# Patient Record
Sex: Male | Born: 1945 | Race: White | Hispanic: No | Smoking: Former smoker
Health system: Southern US, Community
[De-identification: ages and names within clinical notes are randomized; demographics above are authoritative.]

## PROBLEM LIST (undated history)

## (undated) DIAGNOSIS — C8590 Non-Hodgkin lymphoma, unspecified, unspecified site: Secondary | ICD-10-CM

## (undated) DIAGNOSIS — C801 Malignant (primary) neoplasm, unspecified: Secondary | ICD-10-CM

## (undated) DIAGNOSIS — I519 Heart disease, unspecified: Secondary | ICD-10-CM

## (undated) HISTORY — PX: CERVICAL DISC SURGERY: SHX588

## (undated) HISTORY — PX: CORONARY ANGIOPLASTY WITH STENT PLACEMENT: SHX49

---

## 2004-09-09 ENCOUNTER — Other Ambulatory Visit: Payer: Self-pay

## 2010-10-31 ENCOUNTER — Emergency Department: Payer: Self-pay | Admitting: Emergency Medicine

## 2010-11-11 ENCOUNTER — Emergency Department: Payer: Self-pay | Admitting: Emergency Medicine

## 2013-04-14 ENCOUNTER — Ambulatory Visit: Payer: Self-pay | Admitting: Internal Medicine

## 2013-10-11 ENCOUNTER — Emergency Department: Payer: Self-pay | Admitting: Emergency Medicine

## 2013-10-11 LAB — COMPREHENSIVE METABOLIC PANEL
Albumin: 3.5 g/dL (ref 3.4–5.0)
Bilirubin,Total: 0.7 mg/dL (ref 0.2–1.0)
Calcium, Total: 9.3 mg/dL (ref 8.5–10.1)
Chloride: 106 mmol/L (ref 98–107)
Creatinine: 1.53 mg/dL — ABNORMAL HIGH (ref 0.60–1.30)
EGFR (Non-African Amer.): 47 — ABNORMAL LOW
Glucose: 103 mg/dL — ABNORMAL HIGH (ref 65–99)
Osmolality: 279 (ref 275–301)
SGOT(AST): 18 U/L (ref 15–37)
SGPT (ALT): 29 U/L (ref 12–78)
Sodium: 139 mmol/L (ref 136–145)

## 2013-10-11 LAB — URINALYSIS, COMPLETE
Bacteria: NONE SEEN
Bilirubin,UR: NEGATIVE
Glucose,UR: NEGATIVE mg/dL (ref 0–75)
Ketone: NEGATIVE
Leukocyte Esterase: NEGATIVE
Ph: 6 (ref 4.5–8.0)
RBC,UR: 1 /HPF (ref 0–5)
Squamous Epithelial: NONE SEEN

## 2013-10-11 LAB — CBC
HCT: 43.6 % (ref 40.0–52.0)
MCV: 94 fL (ref 80–100)
Platelet: 208 10*3/uL (ref 150–440)
RBC: 4.64 10*6/uL (ref 4.40–5.90)
RDW: 14.1 % (ref 11.5–14.5)
WBC: 11 10*3/uL — ABNORMAL HIGH (ref 3.8–10.6)

## 2013-10-11 LAB — TROPONIN I: Troponin-I: <0.02 ng/mL

## 2013-10-11 LAB — CK TOTAL AND CKMB (NOT AT ARMC): CK-MB: 2 ng/mL (ref 0.5–3.6)

## 2013-10-11 LAB — LIPASE, BLOOD: Lipase: 135 U/L (ref 73–393)

## 2015-06-20 DIAGNOSIS — I2510 Atherosclerotic heart disease of native coronary artery without angina pectoris: Secondary | ICD-10-CM | POA: Insufficient documentation

## 2015-06-20 DIAGNOSIS — R945 Abnormal results of liver function studies: Secondary | ICD-10-CM | POA: Insufficient documentation

## 2015-06-20 DIAGNOSIS — C8590 Non-Hodgkin lymphoma, unspecified, unspecified site: Secondary | ICD-10-CM | POA: Insufficient documentation

## 2015-06-20 DIAGNOSIS — I10 Essential (primary) hypertension: Secondary | ICD-10-CM | POA: Insufficient documentation

## 2015-06-20 DIAGNOSIS — R7989 Other specified abnormal findings of blood chemistry: Secondary | ICD-10-CM | POA: Insufficient documentation

## 2015-06-20 DIAGNOSIS — E785 Hyperlipidemia, unspecified: Secondary | ICD-10-CM | POA: Insufficient documentation

## 2015-06-20 DIAGNOSIS — E039 Hypothyroidism, unspecified: Secondary | ICD-10-CM | POA: Insufficient documentation

## 2015-07-11 ENCOUNTER — Other Ambulatory Visit: Payer: Self-pay | Admitting: Internal Medicine

## 2015-07-13 ENCOUNTER — Other Ambulatory Visit: Payer: Self-pay | Admitting: Internal Medicine

## 2015-07-17 ENCOUNTER — Other Ambulatory Visit: Payer: Self-pay | Admitting: Internal Medicine

## 2015-07-28 ENCOUNTER — Encounter: Payer: Self-pay | Admitting: Internal Medicine

## 2015-07-28 ENCOUNTER — Ambulatory Visit (INDEPENDENT_AMBULATORY_CARE_PROVIDER_SITE_OTHER): Payer: Medicare Other | Admitting: Internal Medicine

## 2015-07-28 VITALS — BP 128/80 | HR 88 | Temp 98.4°F | Ht 64.0 in | Wt 169.8 lb

## 2015-07-28 DIAGNOSIS — E782 Mixed hyperlipidemia: Secondary | ICD-10-CM | POA: Diagnosis not present

## 2015-07-28 DIAGNOSIS — H109 Unspecified conjunctivitis: Secondary | ICD-10-CM | POA: Diagnosis not present

## 2015-07-28 DIAGNOSIS — E034 Atrophy of thyroid (acquired): Secondary | ICD-10-CM

## 2015-07-28 DIAGNOSIS — I10 Essential (primary) hypertension: Secondary | ICD-10-CM | POA: Diagnosis not present

## 2015-07-28 DIAGNOSIS — H6691 Otitis media, unspecified, right ear: Secondary | ICD-10-CM

## 2015-07-28 DIAGNOSIS — E038 Other specified hypothyroidism: Secondary | ICD-10-CM

## 2015-07-28 MED ORDER — AMOXICILLIN-POT CLAVULANATE 875-125 MG PO TABS: 1.0000 | ORAL_TABLET | Freq: Two times a day (BID) | ORAL | Status: DC

## 2015-07-28 MED ORDER — NEOMYCIN-POLYMYXIN-DEXAMETH 3.5-10000-0.1 OP SUSP: 2.0000 [drp] | Freq: Four times a day (QID) | OPHTHALMIC | Status: DC

## 2015-07-28 NOTE — Progress Notes (Signed)
Date:  07/28/2015   Name:  Kyle Choi   DOB:  06-25-1946   MRN:  [id number]   Chief Complaint: Sinusitis Sinusitis This is a new problem. The current episode started in the past 7 days. The problem is unchanged. There has been no fever. He is experiencing no pain. Associated symptoms include congestion, ear pain, headaches, a hoarse voice, sinus pressure and a sore throat. Pertinent negatives include no diaphoresis, neck pain, shortness of breath or swollen glands. Past treatments include oral decongestants. The treatment provided mild relief.  Hypertension This is a chronic problem. The current episode started more than 1 year ago. The problem is unchanged. The problem is controlled. Associated symptoms include headaches. Pertinent negatives include no chest pain, neck pain, palpitations or shortness of breath. Past treatments include angiotensin blockers and beta blockers. The current treatment provides significant improvement. There are no compliance problems.   Hyperlipidemia This is a chronic problem. The problem is controlled. Pertinent negatives include no chest pain, focal weakness, leg pain, myalgias or shortness of breath. Current antihyperlipidemic treatment includes statins. The current treatment provides significant improvement of lipids.   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  He sees his oncologist next month.  He denies weight loss, nausea or headache.  Review of Systems:  Review of Systems  Constitutional: Negative for diaphoresis.  HENT: Positive for congestion, ear pain, hoarse voice, sinus pressure and sore throat. Negative for tinnitus.   Respiratory: Negative for chest tightness and shortness of breath.   Cardiovascular: Negative for chest pain, palpitations and leg swelling.  Gastrointestinal: Negative for abdominal pain and abdominal distention.  Musculoskeletal: Negative for myalgias, arthralgias and neck pain.  Neurological: Positive for headaches. Negative for focal  weakness.  Psychiatric/Behavioral: Negative for dysphoric mood.    Patient Active Problem List   Diagnosis Date Noted  . CAD in native artery 06/20/2015  . Lymphoma in remission 06/20/2015  . Abnormal LFTs 06/20/2015  . Essential (primary) hypertension 06/20/2015  . Hypothyroidism 06/20/2015  . Lymphoma 06/20/2015  . Combined fat and carbohydrate induced hyperlipemia 06/20/2015    Prior to Admission medications   Medication Sig Start Date End Date Taking? Authorizing Provider  aspirin 81 MG tablet Take 1 tablet by mouth daily.   Yes Historical Provider, MD  atorvastatin (LIPITOR) 40 MG tablet Take 1 tablet by mouth daily. 04/12/15  Yes Historical Provider, MD  levothyroxine (SYNTHROID, LEVOTHROID) 100 MCG tablet TAKE ONE TABLET BY MOUTH ONCE DAILY 07/17/15  Yes Glean Hess, MD  losartan (COZAAR) 50 MG tablet Take 1 tablet by mouth daily. 04/12/15  Yes Historical Provider, MD  metoprolol tartrate (LOPRESSOR) 25 MG tablet TAKE ONE-HALF TABLET BY MOUTH TWICE DAILY 07/13/15  Yes Glean Hess, MD  potassium chloride 20 MEQ/15ML (10%) SOLN Take by mouth.   Yes Historical Provider, MD    Allergies  Allergen Reactions  . Ace Inhibitors Cough    Past Surgical History  Procedure Laterality Date  . Cervical disc surgery      c1-7 [facility]  . Coronary angioplasty with stent placement      History  Substance Use Topics  . Smoking status: Former Research scientist (life sciences)  . Smokeless tobacco: Not on file  . Alcohol Use: No     Medication list has been reviewed and updated.  Physical Examination:  Physical Exam  BP 162/70 mmHg  Pulse 88  Temp(Src) 98.4 F (36.9 C)  Ht 5\' 4"  (1.626 m)  Wt 169 lb 12.8 oz (77.021 kg)  BMI 29.13 kg/m2  SpO2 97%  Assessment and Plan: 1. Essential (primary) hypertension controlled  2. Combined fat and carbohydrate induced hyperlipemia On statin therapy Need follow up labs at [facility]  3. 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  Bilateral conjunctivitis - neomycin-polymyxin b-dexamethasone (MAXITROL) 3.5-10000-0.1 SUSP; Place 2 drops into both eyes every 6 (six) hours.  Dispense: 5 mL; Refill: 0  5. Otitis, right Use mucinex-D bid for 5 days - amoxicillin-clavulanate (AUGMENTIN) 875-125 MG per tablet; Take 1 tablet by mouth 2 (two) times daily.  Dispense: 20 tablet; Refill: 0   Halina Maidens, MD [facility]  07/28/2015

## 2015-08-09 ENCOUNTER — Encounter: Payer: Self-pay | Admitting: Internal Medicine

## 2015-08-15 ENCOUNTER — Other Ambulatory Visit: Payer: Self-pay | Admitting: Internal Medicine

## 2015-09-10 ENCOUNTER — Other Ambulatory Visit: Payer: Self-pay | Admitting: Internal Medicine

## 2015-09-11 ENCOUNTER — Other Ambulatory Visit: Payer: Self-pay

## 2015-10-02 ENCOUNTER — Encounter: Payer: Self-pay | Admitting: Emergency Medicine

## 2015-10-02 DIAGNOSIS — Z87891 Personal history of nicotine dependence: Secondary | ICD-10-CM | POA: Insufficient documentation

## 2015-10-02 DIAGNOSIS — R21 Rash and other nonspecific skin eruption: Secondary | ICD-10-CM | POA: Insufficient documentation

## 2015-10-02 NOTE — ED Notes (Signed)
Patient ambulatory to triage with steady gait, without difficulty or distress noted; pt reports since this morning having itchy rash with no known cause

## 2015-10-03 ENCOUNTER — Emergency Department
Admission: EM | Admit: 2015-10-03 | Discharge: 2015-10-03 | Discharge status: Against Medical Advice | Payer: Medicare Other | Attending: Emergency Medicine | Admitting: Emergency Medicine

## 2015-10-08 ENCOUNTER — Other Ambulatory Visit: Payer: Self-pay | Admitting: Internal Medicine

## 2015-10-31 ENCOUNTER — Encounter: Payer: Medicare Other | Admitting: Internal Medicine

## 2015-11-05 ENCOUNTER — Other Ambulatory Visit: Payer: Self-pay | Admitting: Internal Medicine

## 2015-11-06 ENCOUNTER — Other Ambulatory Visit: Payer: Self-pay | Admitting: Internal Medicine

## 2015-11-16 ENCOUNTER — Ambulatory Visit: Payer: Medicare Other | Admitting: Internal Medicine

## 2015-11-22 ENCOUNTER — Ambulatory Visit: Payer: Medicare Other | Admitting: Internal Medicine

## 2015-12-11 ENCOUNTER — Other Ambulatory Visit: Payer: Self-pay | Admitting: Internal Medicine

## 2016-01-08 ENCOUNTER — Other Ambulatory Visit: Payer: Self-pay | Admitting: Internal Medicine

## 2016-01-08 NOTE — Telephone Encounter (Signed)
No additional refills until seen for HTN and lipid followup.

## 2016-01-10 NOTE — Telephone Encounter (Signed)
pts coming in on 2/22

## 2016-02-08 ENCOUNTER — Other Ambulatory Visit: Payer: Self-pay | Admitting: Internal Medicine

## 2016-02-21 ENCOUNTER — Ambulatory Visit (INDEPENDENT_AMBULATORY_CARE_PROVIDER_SITE_OTHER): Payer: Medicare Other | Admitting: Internal Medicine

## 2016-02-21 ENCOUNTER — Encounter: Payer: Self-pay | Admitting: Internal Medicine

## 2016-02-21 VITALS — BP 132/66 | HR 68 | Ht 64.0 in | Wt 161.2 lb

## 2016-02-21 DIAGNOSIS — E038 Other specified hypothyroidism: Secondary | ICD-10-CM

## 2016-02-21 DIAGNOSIS — C8590 Non-Hodgkin lymphoma, unspecified, unspecified site: Secondary | ICD-10-CM

## 2016-02-21 DIAGNOSIS — R7989 Other specified abnormal findings of blood chemistry: Secondary | ICD-10-CM

## 2016-02-21 DIAGNOSIS — I2510 Atherosclerotic heart disease of native coronary artery without angina pectoris: Secondary | ICD-10-CM | POA: Diagnosis not present

## 2016-02-21 DIAGNOSIS — I10 Essential (primary) hypertension: Secondary | ICD-10-CM

## 2016-02-21 DIAGNOSIS — Z8572 Personal history of non-Hodgkin lymphomas: Secondary | ICD-10-CM

## 2016-02-21 DIAGNOSIS — R945 Abnormal results of liver function studies: Secondary | ICD-10-CM

## 2016-02-21 DIAGNOSIS — E034 Atrophy of thyroid (acquired): Secondary | ICD-10-CM | POA: Diagnosis not present

## 2016-02-21 MED ORDER — POTASSIUM CHLORIDE 20 MEQ/15ML (10%) PO SOLN: 10.0000 meq | Freq: Two times a day (BID) | ORAL | Status: DC

## 2016-02-21 NOTE — Progress Notes (Signed)
Date:  02/21/2016   Name:  ANTWAIN DAM   DOB:  Nov 12, 1946   MRN:  [id number]   Chief Complaint: Follow-up; Hypertension; and Hypothyroidism Hypertension This is a chronic problem. The current episode started more than 1 year ago. The problem is unchanged. The problem is controlled. Associated symptoms include headaches. Pertinent negatives include no chest pain, palpitations or shortness of breath. Hypertensive end-organ damage includes a thyroid problem.  Thyroid Problem Presents for follow-up visit. Patient reports no constipation, depressed mood, fatigue, heat intolerance, hoarse voice, leg swelling or palpitations. Past treatments include levothyroxine.   CAD -  Patient reports no chest pain or SOB.  He is active caring for his 74 month old grandson.  He sees his cardiologist regularly but has not had labs done recently.  Lymphoma - reported to be in remission per his Oncologist.  He continues to see them every 6 months and this year, if all is clear, will decrease to once per year.   Review of Systems  Constitutional: Negative for fever, chills and fatigue.  HENT: Negative for hearing loss and hoarse voice.   Eyes: Positive for discharge and visual disturbance. Negative for photophobia.  Respiratory: Negative for cough, chest tightness and shortness of breath.   Cardiovascular: Negative for chest pain, palpitations and leg swelling.  Gastrointestinal: Negative for constipation.  Endocrine: Negative for heat intolerance.  Genitourinary: Negative for dysuria.  Neurological: Positive for dizziness, light-headedness and headaches.    Patient Active Problem List   Diagnosis Date Noted  . CAD in native artery 06/20/2015  . Lymphoma in remission ([place]) 06/20/2015  . Abnormal LFTs 06/20/2015  . Essential (primary) hypertension 06/20/2015  . Hypothyroidism 06/20/2015  . Lymphoma ([place]) 06/20/2015  . 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 Authorizing Provider  atorvastatin (LIPITOR) 40 MG tablet TAKE ONE TABLET BY MOUTH ONCE DAILY 11/05/15  Yes Glean Hess, MD  levothyroxine (SYNTHROID, LEVOTHROID) 100 MCG tablet TAKE ONE TABLET BY MOUTH ONCE DAILY 02/08/16  Yes Glean Hess, MD  losartan (COZAAR) 50 MG tablet TAKE ONE TABLET BY MOUTH ONCE DAILY 02/08/16  Yes Glean Hess, MD  metoprolol tartrate (LOPRESSOR) 25 MG tablet TAKE ONE-HALF TABLET BY MOUTH TWICE DAILY 02/08/16  Yes Glean Hess, MD  potassium chloride 20 MEQ/15ML (10%) SOLN Take by mouth.   Yes Historical Provider, MD  aspirin 81 MG tablet Take 1 tablet by mouth daily.    Historical Provider, MD    Allergies  Allergen Reactions  . Ace Inhibitors Cough    Past Surgical History  Procedure Laterality Date  . Cervical disc surgery      c1-7 [facility]  . Coronary angioplasty with stent placement      Social History  Substance Use Topics  . Smoking status: Former Research scientist (life sciences)  . Smokeless tobacco: None  . Alcohol Use: No    Medication list has been reviewed and updated.  Physical Exam  Constitutional: He is oriented to person, place, and time. He appears well-developed. No distress.  HENT:  Head: Normocephalic and atraumatic.  Neck: Normal range of motion. No thyromegaly present.  Cardiovascular: Normal rate, regular rhythm and normal heart sounds.   Pulmonary/Chest: Effort normal and breath sounds normal. No respiratory distress.  Musculoskeletal: Normal range of motion.  Lymphadenopathy:    He has no cervical adenopathy.  Neurological: He is alert and oriented to person, place, and  time.  Skin: Skin is warm and dry. No rash noted.  Psychiatric: He has a normal mood and affect. His behavior is normal. Thought content normal.  Nursing note and vitals reviewed.   BP 132/66 mmHg  Pulse 68  Ht 5\' 4"  (1.626 m)  Wt 161 lb 3.2 oz (73.12 kg)  BMI 27.66 kg/m2  Assessment and Plan: 1.  Essential (primary) hypertension controlled - CBC with Differential/Platelet - Comprehensive metabolic panel  2. CAD in native artery Asymptomatic - continue aspirin, statin and anti-hypertensives - Lipid panel  3. 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 Lymphoma in remission ([place]) Followed by Oncology  5. Abnormal LFTs [place], MD [facility]  02/21/2016

## 2016-02-22 ENCOUNTER — Telehealth: Payer: Self-pay

## 2016-02-22 LAB — COMPREHENSIVE METABOLIC PANEL
A/G RATIO: 1.3 (ref 1.1–2.5)
ALK PHOS: 125 IU/L — AB (ref 39–117)
ALT: 12 IU/L (ref 0–44)
AST: 12 IU/L (ref 0–40)
Albumin: 3.8 g/dL (ref 3.6–4.8)
BILIRUBIN TOTAL: 0.7 mg/dL (ref 0.0–1.2)
BUN/Creatinine Ratio: 8 — ABNORMAL LOW (ref 10–22)
BUN: 10 mg/dL (ref 8–27)
CALCIUM: 9.5 mg/dL (ref 8.6–10.2)
CHLORIDE: 101 mmol/L (ref 96–106)
CO2: 26 mmol/L (ref 18–29)
Creatinine, Ser: 1.24 mg/dL (ref 0.76–1.27)
GFR calc Af Amer: 69 mL/min/{1.73_m2} (ref >59)
GFR calc non Af Amer: 59 mL/min/{1.73_m2} — ABNORMAL LOW (ref >59)
Globulin, Total: 2.9 g/dL (ref 1.5–4.5)
Glucose: 97 mg/dL (ref 65–99)
POTASSIUM: 4.7 mmol/L (ref 3.5–5.2)
Sodium: 142 mmol/L (ref 134–144)
Total Protein: 6.7 g/dL (ref 6.0–8.5)

## 2016-02-22 LAB — LIPID PANEL
CHOLESTEROL TOTAL: 162 mg/dL (ref 100–199)
Chol/HDL Ratio: 3.4 ratio units (ref 0.0–5.0)
HDL: 47 mg/dL (ref >39)
LDL Calculated: 101 mg/dL — ABNORMAL HIGH (ref 0–99)
TRIGLYCERIDES: 68 mg/dL (ref 0–149)
VLDL Cholesterol Cal: 14 mg/dL (ref 5–40)

## 2016-02-22 LAB — CBC WITH DIFFERENTIAL/PLATELET
BASOS: 0 %
Basophils Absolute: 0 10*3/uL (ref 0.0–0.2)
EOS (ABSOLUTE): 0.3 10*3/uL (ref 0.0–0.4)
Eos: 2 %
Hematocrit: 46.1 % (ref 37.5–51.0)
Hemoglobin: 14.9 g/dL (ref 12.6–17.7)
IMMATURE GRANULOCYTES: 0 %
Immature Grans (Abs): 0 10*3/uL (ref 0.0–0.1)
Lymphocytes Absolute: 1.3 10*3/uL (ref 0.7–3.1)
Lymphs: 10 %
MCH: 29.6 pg (ref 26.6–33.0)
MCHC: 32.3 g/dL (ref 31.5–35.7)
MCV: 92 fL (ref 79–97)
MONOS ABS: 1.2 10*3/uL — AB (ref 0.1–0.9)
Monocytes: 9 %
NEUTROS PCT: 79 %
Neutrophils Absolute: 10.1 10*3/uL — ABNORMAL HIGH (ref 1.4–7.0)
PLATELETS: 312 10*3/uL (ref 150–379)
RBC: 5.04 x10E6/uL (ref 4.14–5.80)
RDW: 14.9 % (ref 12.3–15.4)
WBC: 12.9 10*3/uL — ABNORMAL HIGH (ref 3.4–10.8)

## 2016-02-22 LAB — TSH: TSH: 0.226 u[IU]/mL — ABNORMAL LOW (ref 0.450–4.500)

## 2016-02-22 NOTE — Telephone Encounter (Signed)
Spoke with patient. Patient advised of all results and verbalized understanding. Will call back with any future questions or concerns. MAH  

## 2016-02-22 NOTE — Telephone Encounter (Signed)
-----   Message from Glean Hess, MD sent at 02/22/2016 12:23 PM EST ----- Labs are normal.  Kidney function is good.  Liver tests are stable.  Cholesterol is good.  Continue same medication.

## 2016-05-30 ENCOUNTER — Telehealth: Payer: Self-pay

## 2016-05-30 MED ORDER — METOPROLOL TARTRATE 25 MG PO TABS: 12.5000 mg | ORAL_TABLET | Freq: Two times a day (BID) | ORAL | Status: AC

## 2016-05-30 NOTE — Telephone Encounter (Signed)
ADVISED

## 2016-06-25 ENCOUNTER — Emergency Department: Payer: Medicare Other

## 2016-06-25 ENCOUNTER — Inpatient Hospital Stay
Admission: EM | Admit: 2016-06-25 | Discharge: 2016-06-29 | DRG: 065 | Disposition: A | Discharge status: Skilled Nursing Facility | Payer: Medicare Other | Attending: Internal Medicine | Admitting: Internal Medicine

## 2016-06-25 DIAGNOSIS — R1310 Dysphagia, unspecified: Secondary | ICD-10-CM | POA: Diagnosis present

## 2016-06-25 DIAGNOSIS — J189 Pneumonia, unspecified organism: Secondary | ICD-10-CM

## 2016-06-25 DIAGNOSIS — Z955 Presence of coronary angioplasty implant and graft: Secondary | ICD-10-CM

## 2016-06-25 DIAGNOSIS — Z8249 Family history of ischemic heart disease and other diseases of the circulatory system: Secondary | ICD-10-CM

## 2016-06-25 DIAGNOSIS — W19XXXA Unspecified fall, initial encounter: Secondary | ICD-10-CM

## 2016-06-25 DIAGNOSIS — Z87891 Personal history of nicotine dependence: Secondary | ICD-10-CM

## 2016-06-25 DIAGNOSIS — E785 Hyperlipidemia, unspecified: Secondary | ICD-10-CM | POA: Diagnosis present

## 2016-06-25 DIAGNOSIS — I10 Essential (primary) hypertension: Secondary | ICD-10-CM | POA: Diagnosis present

## 2016-06-25 DIAGNOSIS — I639 Cerebral infarction, unspecified: Principal | ICD-10-CM

## 2016-06-25 DIAGNOSIS — R471 Dysarthria and anarthria: Secondary | ICD-10-CM | POA: Diagnosis present

## 2016-06-25 DIAGNOSIS — D72829 Elevated white blood cell count, unspecified: Secondary | ICD-10-CM | POA: Diagnosis present

## 2016-06-25 DIAGNOSIS — R4182 Altered mental status, unspecified: Secondary | ICD-10-CM

## 2016-06-25 DIAGNOSIS — C8590 Non-Hodgkin lymphoma, unspecified, unspecified site: Secondary | ICD-10-CM | POA: Diagnosis present

## 2016-06-25 DIAGNOSIS — E039 Hypothyroidism, unspecified: Secondary | ICD-10-CM | POA: Diagnosis present

## 2016-06-25 DIAGNOSIS — Z7982 Long term (current) use of aspirin: Secondary | ICD-10-CM

## 2016-06-25 DIAGNOSIS — I69354 Hemiplegia and hemiparesis following cerebral infarction affecting left non-dominant side: Secondary | ICD-10-CM | POA: Insufficient documentation

## 2016-06-25 DIAGNOSIS — G459 Transient cerebral ischemic attack, unspecified: Secondary | ICD-10-CM

## 2016-06-25 DIAGNOSIS — I63212 Cerebral infarction due to unspecified occlusion or stenosis of left vertebral arteries: Secondary | ICD-10-CM | POA: Insufficient documentation

## 2016-06-25 DIAGNOSIS — R4586 Emotional lability: Secondary | ICD-10-CM | POA: Diagnosis present

## 2016-06-25 DIAGNOSIS — Z888 Allergy status to other drugs, medicaments and biological substances status: Secondary | ICD-10-CM

## 2016-06-25 DIAGNOSIS — Z79899 Other long term (current) drug therapy: Secondary | ICD-10-CM

## 2016-06-25 HISTORY — DX: Non-Hodgkin lymphoma, unspecified, unspecified site: C85.90

## 2016-06-25 HISTORY — DX: Heart disease, unspecified: I51.9

## 2016-06-25 HISTORY — DX: Malignant (primary) neoplasm, unspecified: C80.1

## 2016-06-25 LAB — CBC
HEMATOCRIT: 48.4 % (ref 40.0–52.0)
Hemoglobin: 16 g/dL (ref 13.0–18.0)
MCH: 30 pg (ref 26.0–34.0)
MCHC: 33.1 g/dL (ref 32.0–36.0)
MCV: 90.5 fL (ref 80.0–100.0)
PLATELETS: 316 10*3/uL (ref 150–440)
RBC: 5.35 MIL/uL (ref 4.40–5.90)
RDW: 14.8 % — ABNORMAL HIGH (ref 11.5–14.5)
WBC: 17.8 10*3/uL — AB (ref 3.8–10.6)

## 2016-06-25 LAB — COMPREHENSIVE METABOLIC PANEL
ALT: 14 U/L — ABNORMAL LOW (ref 17–63)
AST: 22 U/L (ref 15–41)
Albumin: 3.9 g/dL (ref 3.5–5.0)
Alkaline Phosphatase: 114 U/L (ref 38–126)
Anion gap: 11 (ref 5–15)
BUN: 16 mg/dL (ref 6–20)
CHLORIDE: 99 mmol/L — AB (ref 101–111)
CO2: 25 mmol/L (ref 22–32)
CREATININE: 1.28 mg/dL — AB (ref 0.61–1.24)
Calcium: 9.4 mg/dL (ref 8.9–10.3)
GFR, EST NON AFRICAN AMERICAN: 55 mL/min — AB (ref >60)
Glucose, Bld: 108 mg/dL — ABNORMAL HIGH (ref 65–99)
POTASSIUM: 4 mmol/L (ref 3.5–5.1)
Sodium: 135 mmol/L (ref 135–145)
Total Bilirubin: 1.6 mg/dL — ABNORMAL HIGH (ref 0.3–1.2)
Total Protein: 7.9 g/dL (ref 6.5–8.1)

## 2016-06-25 LAB — TROPONIN I: Troponin I: 0.08 ng/mL (ref <0.03)

## 2016-06-25 NOTE — ED Notes (Signed)
Lab called regarding add on trop at this time, will add on momentarily.

## 2016-06-25 NOTE — ED Notes (Signed)
Pt to ED via [facility] from home c/o altered mental status. Per EMS pt was found down on the ground with unknown downtime, pt denies LOC. Per EMS pt's daughter reports pt has been lethargic, confused, and has had a suppressed appetite for the past 3 days. A&O in no acute distress at this time.

## 2016-06-25 NOTE — ED Notes (Signed)
MD at bedside. 

## 2016-06-26 ENCOUNTER — Inpatient Hospital Stay: Payer: Medicare Other

## 2016-06-26 ENCOUNTER — Inpatient Hospital Stay: Admit: 2016-06-26 | Payer: Medicare Other

## 2016-06-26 ENCOUNTER — Encounter: Payer: Self-pay | Admitting: Internal Medicine

## 2016-06-26 ENCOUNTER — Inpatient Hospital Stay
Admit: 2016-06-26 | Discharge: 2016-06-26 | Disposition: A | Discharge status: Home / Self Care | Payer: Medicare Other | Attending: Internal Medicine | Admitting: Internal Medicine

## 2016-06-26 DIAGNOSIS — Z79899 Other long term (current) drug therapy: Secondary | ICD-10-CM | POA: Diagnosis not present

## 2016-06-26 DIAGNOSIS — I10 Essential (primary) hypertension: Secondary | ICD-10-CM | POA: Diagnosis present

## 2016-06-26 DIAGNOSIS — Z7982 Long term (current) use of aspirin: Secondary | ICD-10-CM | POA: Diagnosis not present

## 2016-06-26 DIAGNOSIS — G459 Transient cerebral ischemic attack, unspecified: Secondary | ICD-10-CM | POA: Diagnosis present

## 2016-06-26 DIAGNOSIS — E785 Hyperlipidemia, unspecified: Secondary | ICD-10-CM | POA: Diagnosis present

## 2016-06-26 DIAGNOSIS — Z8249 Family history of ischemic heart disease and other diseases of the circulatory system: Secondary | ICD-10-CM | POA: Diagnosis not present

## 2016-06-26 DIAGNOSIS — Z888 Allergy status to other drugs, medicaments and biological substances status: Secondary | ICD-10-CM | POA: Diagnosis not present

## 2016-06-26 DIAGNOSIS — R4182 Altered mental status, unspecified: Secondary | ICD-10-CM | POA: Diagnosis present

## 2016-06-26 DIAGNOSIS — D72829 Elevated white blood cell count, unspecified: Secondary | ICD-10-CM | POA: Diagnosis present

## 2016-06-26 DIAGNOSIS — I639 Cerebral infarction, unspecified: Principal | ICD-10-CM

## 2016-06-26 DIAGNOSIS — R4586 Emotional lability: Secondary | ICD-10-CM | POA: Diagnosis present

## 2016-06-26 DIAGNOSIS — C8590 Non-Hodgkin lymphoma, unspecified, unspecified site: Secondary | ICD-10-CM | POA: Diagnosis present

## 2016-06-26 DIAGNOSIS — Z87891 Personal history of nicotine dependence: Secondary | ICD-10-CM | POA: Diagnosis not present

## 2016-06-26 DIAGNOSIS — Z955 Presence of coronary angioplasty implant and graft: Secondary | ICD-10-CM | POA: Diagnosis not present

## 2016-06-26 DIAGNOSIS — R471 Dysarthria and anarthria: Secondary | ICD-10-CM | POA: Diagnosis present

## 2016-06-26 DIAGNOSIS — R1310 Dysphagia, unspecified: Secondary | ICD-10-CM | POA: Diagnosis present

## 2016-06-26 DIAGNOSIS — E039 Hypothyroidism, unspecified: Secondary | ICD-10-CM | POA: Diagnosis present

## 2016-06-26 LAB — CBC
HCT: 42.4 % (ref 40.0–52.0)
Hemoglobin: 14.2 g/dL (ref 13.0–18.0)
MCH: 30 pg (ref 26.0–34.0)
MCHC: 33.5 g/dL (ref 32.0–36.0)
MCV: 89.5 fL (ref 80.0–100.0)
PLATELETS: 263 10*3/uL (ref 150–440)
RBC: 4.74 MIL/uL (ref 4.40–5.90)
RDW: 14.8 % — ABNORMAL HIGH (ref 11.5–14.5)
WBC: 14.2 10*3/uL — ABNORMAL HIGH (ref 3.8–10.6)

## 2016-06-26 LAB — URINALYSIS COMPLETE WITH MICROSCOPIC (ARMC ONLY)
BILIRUBIN URINE: NEGATIVE
Bacteria, UA: NONE SEEN
GLUCOSE, UA: NEGATIVE mg/dL
HGB URINE DIPSTICK: NEGATIVE
LEUKOCYTES UA: NEGATIVE
NITRITE: NEGATIVE
PH: 5 (ref 5.0–8.0)
Protein, ur: NEGATIVE mg/dL
SPECIFIC GRAVITY, URINE: 1.018 (ref 1.005–1.030)

## 2016-06-26 LAB — CREATININE, SERUM
CREATININE: 1.29 mg/dL — AB (ref 0.61–1.24)
GFR calc Af Amer: >60 mL/min (ref >60)
GFR calc non Af Amer: 55 mL/min — ABNORMAL LOW (ref >60)

## 2016-06-26 MED ORDER — ASPIRIN 325 MG PO TABS
325.0000 mg | ORAL_TABLET | Freq: Every day | ORAL | Status: DC
  Administered 2016-06-26 – 2016-06-28 (×3): 325 mg via ORAL
  Filled 2016-06-26 (×3): qty 1

## 2016-06-26 MED ORDER — IPRATROPIUM-ALBUTEROL 0.5-2.5 (3) MG/3ML IN SOLN
3.0000 mL | RESPIRATORY_TRACT | Status: DC | PRN
  Administered 2016-06-26: 3 mL via RESPIRATORY_TRACT
  Filled 2016-06-26: qty 3

## 2016-06-26 MED ORDER — CLOPIDOGREL BISULFATE 75 MG PO TABS
75.0000 mg | ORAL_TABLET | Freq: Every day | ORAL | Status: DC
  Administered 2016-06-26 – 2016-06-29 (×4): 75 mg via ORAL
  Filled 2016-06-26 (×4): qty 1

## 2016-06-26 MED ORDER — CHLORHEXIDINE GLUCONATE 0.12 % MT SOLN
15.0000 mL | Freq: Two times a day (BID) | OROMUCOSAL | Status: DC
  Administered 2016-06-27 – 2016-06-29 (×4): 15 mL via OROMUCOSAL
  Filled 2016-06-26 (×4): qty 15

## 2016-06-26 MED ORDER — SODIUM CHLORIDE 0.9 % IV SOLN
INTRAVENOUS | Status: DC
  Administered 2016-06-26 – 2016-06-27 (×2): via INTRAVENOUS

## 2016-06-26 MED ORDER — LEVOTHYROXINE SODIUM 100 MCG PO TABS
100.0000 ug | ORAL_TABLET | Freq: Every day | ORAL | Status: DC
  Administered 2016-06-27 – 2016-06-29 (×3): 100 ug via ORAL
  Filled 2016-06-26 (×3): qty 1

## 2016-06-26 MED ORDER — LOSARTAN POTASSIUM 50 MG PO TABS
50.0000 mg | ORAL_TABLET | Freq: Every day | ORAL | Status: DC
  Administered 2016-06-27 – 2016-06-29 (×3): 50 mg via ORAL
  Filled 2016-06-26 (×3): qty 1

## 2016-06-26 MED ORDER — ENOXAPARIN SODIUM 40 MG/0.4ML ~~LOC~~ SOLN
40.0000 mg | SUBCUTANEOUS | Status: DC
  Administered 2016-06-26 – 2016-06-28 (×3): 40 mg via SUBCUTANEOUS
  Filled 2016-06-26 (×3): qty 0.4

## 2016-06-26 MED ORDER — CETYLPYRIDINIUM CHLORIDE 0.05 % MT LIQD
7.0000 mL | Freq: Two times a day (BID) | OROMUCOSAL | Status: DC
  Administered 2016-06-26 – 2016-06-29 (×6): 7 mL via OROMUCOSAL

## 2016-06-26 MED ORDER — STROKE: EARLY STAGES OF RECOVERY BOOK
Freq: Once | Status: AC
  Administered 2016-06-26: 04:00:00

## 2016-06-26 MED ORDER — METOPROLOL TARTRATE 25 MG PO TABS
12.5000 mg | ORAL_TABLET | Freq: Two times a day (BID) | ORAL | Status: DC
  Administered 2016-06-26 – 2016-06-29 (×6): 12.5 mg via ORAL
  Filled 2016-06-26 (×6): qty 1

## 2016-06-26 MED ORDER — ATORVASTATIN CALCIUM 20 MG PO TABS
40.0000 mg | ORAL_TABLET | Freq: Every day | ORAL | Status: DC
  Administered 2016-06-26 – 2016-06-28 (×3): 40 mg via ORAL
  Filled 2016-06-26 (×3): qty 2

## 2016-06-26 MED ORDER — IOPAMIDOL (ISOVUE-370) INJECTION 76%
75.0000 mL | Freq: Once | INTRAVENOUS | Status: AC | PRN
  Administered 2016-06-26: 75 mL via INTRAVENOUS

## 2016-06-26 MED ORDER — ATORVASTATIN CALCIUM 20 MG PO TABS: 40.0000 mg | ORAL_TABLET | Freq: Every day | ORAL | Status: DC

## 2016-06-26 MED ORDER — ASPIRIN 81 MG PO CHEW: 324.0000 mg | CHEWABLE_TABLET | Freq: Once | ORAL | Status: DC

## 2016-06-26 MED ORDER — ASPIRIN 300 MG RE SUPP: 300.0000 mg | Freq: Every day | RECTAL | Status: DC

## 2016-06-26 NOTE — Progress Notes (Signed)
Patient is alert and oriented. Q2 neuro checks completed. Left arm and leg have weakness according to patient and left arm has a slight drift. Pupils are equal and intact with no loss of visual fields. Patient does report some blurred vision. He is alert and oriented but still has very slurred speech. At times patient can speak full sentences when his emotions calm down some. Patient is very tearful. Family has been in and out of room all day. Vitals are stable. NSR on tele. No complaints of pain.

## 2016-06-26 NOTE — Progress Notes (Signed)
Patient was admitted from the ER following possible stroke. Patient on admission had slur speech, generalized weakness but could move all his extremities and follow instructions. Patient was accompanied by daughter and son. According to the daughter patient has been having the slur speech for about 3 days. Stroke education and booklet were provided for patient and family. He was A&O X4, with couples of emotional crying. Patient and family were oriented to the room. Will continue to monitor.

## 2016-06-26 NOTE — Progress Notes (Signed)
Notified MD that patient has not urinated all day. Patient admitted around 3AM last night and from documentation, it does not look like he has urinated since admission. He has had a couple spots on his pad, but not enough to count as an incontinent episode. Current bladder scan is 335mL. Dr. Anselm Jungling paged and MD stated to keep monitoring patient and to in and out cath if bladder scan is >500.

## 2016-06-26 NOTE — Consult Note (Signed)
Referring Physician: Anselm Jungling    Chief Complaint: Syncope  HPI: Kyle Choi is an 70 y.o. male presented to the emergency room with confusion for the last 3 days. Patient was accompanied by family members, according to them patient has been confused and having slurred speech for the last 3 days. He also had impaired gait and difficulty walking. Patient usually is able to walk by himself and he has a walker at home.  Was found on the floor by daughter on the day of admission and brought in for evaluation.  Initial NIHSS of 3.    Date last known well: Unable to determine Time last known well: Unable to determine tPA Given: No: Outside time window  Past Medical History  Diagnosis Date  . Cancer ([place])   . Lymphoma ([place])   . Heart disease   HTN, hyperlipidemia  Past Surgical History  Procedure Laterality Date  . Cervical disc surgery      c1-7 [facility]  . Coronary angioplasty with stent placement      Family History  Problem Relation Age of Onset  . Heart failure Mother   . Brain cancer Brother    Social History:  reports that he has quit smoking. He does not have any smokeless tobacco history on file. He reports that he does not drink alcohol or use illicit drugs.  Allergies:  Allergies  Allergen Reactions  . Ace Inhibitors Cough    Medications:  I have reviewed the patient's current medications. Prior to Admission:  Prescriptions prior to admission  Medication Sig Dispense Refill Last Dose  . aspirin 81 MG tablet Take 1 tablet by mouth daily.   06/25/2016 at Unknown time  . atorvastatin (LIPITOR) 40 MG tablet TAKE ONE TABLET BY MOUTH ONCE DAILY 30 tablet 0 06/25/2016 at Unknown time  . levothyroxine (SYNTHROID, LEVOTHROID) 100 MCG tablet TAKE ONE TABLET BY MOUTH ONCE DAILY 30 tablet 5 06/25/2016 at Unknown time  . losartan (COZAAR) 50 MG tablet TAKE ONE TABLET BY MOUTH ONCE DAILY 30 tablet 5 06/25/2016 at Unknown time  . metoprolol tartrate (LOPRESSOR) 25 MG tablet Take 0.5  tablets (12.5 mg total) by mouth 2 (two) times daily. 30 tablet 5 06/25/2016 at Unknown time  . potassium chloride 20 MEQ/15ML (10%) SOLN Take 7.5 mLs (10 mEq total) by mouth 2 (two) times daily. 450 mL 5 06/25/2016 at Unknown time   Scheduled: . antiseptic oral rinse  7 mL Mouth Rinse q12n4p  . aspirin  300 mg Rectal Daily   Or  . aspirin  325 mg Oral Daily  . atorvastatin  40 mg Oral q1800  . chlorhexidine  15 mL Mouth Rinse BID  . enoxaparin (LOVENOX) injection  40 mg Subcutaneous Q24H  . levothyroxine  100 mcg Oral QAC breakfast  . losartan  50 mg Oral Daily  . metoprolol tartrate  12.5 mg Oral BID    ROS: History obtained from the patient  General ROS: negative for - chills, fatigue, fever, night sweats, weight gain or weight loss Psychological ROS: negative for - behavioral disorder, hallucinations, memory difficulties, mood swings or suicidal ideation Ophthalmic ROS: negative for - blurry vision, double vision, eye pain or loss of vision ENT ROS: negative for - epistaxis, nasal discharge, oral lesions, sore throat, tinnitus  Allergy and Immunology ROS: negative for - hives or itchy/watery eyes Hematological and Lymphatic ROS: negative for - bleeding problems, bruising or swollen lymph nodes Endocrine ROS: negative for - galactorrhea, hair pattern changes, polydipsia/polyuria or temperature intolerance  Respiratory ROS: negative for - cough, hemoptysis, shortness of breath or wheezing Cardiovascular ROS: negative for - chest pain, dyspnea on exertion, edema or irregular heartbeat Gastrointestinal ROS: negative for - abdominal pain, diarrhea, hematemesis, nausea/vomiting or stool incontinence Genito-Urinary ROS: negative for - dysuria, hematuria, incontinence or urinary frequency/urgency Musculoskeletal ROS: negative for - joint swelling or muscular weakness Neurological ROS: as noted in HPI Dermatological ROS: negative for rash and skin lesion changes  Physical  Examination: Blood pressure 133/58, pulse 73, temperature 98.7 F (37.1 C), temperature source Oral, resp. rate 20, height 5\' 2"  (1.575 m), weight 72.757 kg (160 lb 6.4 oz), SpO2 95 %.  HEENT-  Normocephalic, no lesions, without obvious abnormality.  Normal external eye and conjunctiva.  Normal TM's bilaterally.  Normal auditory canals and external ears. Normal external nose, mucus membranes and septum.  Normal pharynx. Cardiovascular- S1, S2 normal, pulses palpable throughout   Lungs- chest clear, no wheezing, rales, normal symmetric air entry Abdomen- soft, non-tender; bowel sounds normal; no masses,  no organomegaly Extremities- no edema Lymph-no adenopathy palpable Musculoskeletal-no joint tenderness, deformity or swelling Skin-warm and dry, no hyperpigmentation, vitiligo, or suspicious lesions  Neurological Examination Mental Status: Alert, oriented, emotionally labile.  Speech dysarthric.  Able to follow 3 step commands without difficulty. Cranial Nerves: II: Discs flat bilaterally; Visual fields grossly normal, pupils equal, round, reactive to light and accommodation III,IV, VI: ptosis not present, extra-ocular motions intact bilaterally V,VII: smile symmetric, facial light touch sensation normal bilaterally VIII: hearing normal bilaterally IX,X: gag reflex present XI: bilateral shoulder shrug XII: midline tongue extension Motor: Right : Upper extremity   5/5    Left:     Upper extremity   3/5  Lower extremity   5/5     Lower extremity   3/5 Tone and bulk:normal tone throughout; no atrophy noted Sensory: Pinprick and light touch intact throughout, bilaterally Deep Tendon Reflexes: 2+ in the UE's, 1+ at the knees and absent at the ankles Plantars: Right: upgoing   Left: upgoing Cerebellar: Normal finger-to-nose testing on the right.  Unable to perform on the left due to weakness.  Dysmetria with heel-to-shin testing on the right Gait: not tested due to safety  concerns   Laboratory Studies:  Basic Metabolic Panel:  Recent Labs Lab 06/25/16 1840 06/26/16 0534  NA 135  --   K 4.0  --   CL 99*  --   CO2 25  --   GLUCOSE 108*  --   BUN 16  --   CREATININE 1.28* 1.29*  CALCIUM 9.4  --     Liver Function Tests:  Recent Labs Lab 06/25/16 1840  AST 22  ALT 14*  ALKPHOS 114  BILITOT 1.6*  PROT 7.9  ALBUMIN 3.9   No results for input(s): LIPASE, AMYLASE in the last 168 hours. No results for input(s): AMMONIA in the last 168 hours.  CBC:  Recent Labs Lab 06/25/16 1840 06/26/16 0534  WBC 17.8* 14.2*  HGB 16.0 14.2  HCT 48.4 42.4  MCV 90.5 89.5  PLT 316 263    Cardiac Enzymes:  Recent Labs Lab 06/25/16 1840  TROPONINI 0.08*    BNP: Invalid input(s): POCBNP  CBG: No results for input(s): GLUCAP in the last 168 hours.  Microbiology: No results found for this or any previous visit.  Coagulation Studies: No results for input(s): LABPROT, INR in the last 72 hours.  Urinalysis:  Recent Labs Lab 06/25/16 [place] 1.018  PHURINE 5.0  GLUCOSEU  NEGATIVE  HGBUR NEGATIVE  BILIRUBINUR NEGATIVE  KETONESUR 1+*  PROTEINUR NEGATIVE  NITRITE NEGATIVE  LEUKOCYTESUR NEGATIVE    Lipid Panel:    Component Value Date/Time   CHOL 162 02/21/2016 1402   TRIG 68 02/21/2016 1402   HDL 47 02/21/2016 1402   CHOLHDL 3.4 02/21/2016 1402   LDLCALC 101* 02/21/2016 1402    HgbA1C: No results found for: HGBA1C  Urine Drug Screen:  No results found for: LABOPIA, COCAINSCRNUR, LABBENZ, AMPHETMU, THCU, LABBARB  Alcohol Level: No results for input(s): ETH in the last 168 hours.  Other results: EKG: sinus rhythm at 97 bpm.  Imaging: Ct Head Wo Contrast  06/25/2016  CLINICAL DATA:  Patient found down. EXAM: CT HEAD WITHOUT CONTRAST CT CERVICAL SPINE WITHOUT CONTRAST TECHNIQUE: Multidetector CT imaging of the head and cervical spine was performed following the standard protocol without intravenous  contrast. Multiplanar CT image reconstructions of the cervical spine were also generated. COMPARISON:  None. FINDINGS: CT HEAD FINDINGS Paranasal sinuses, mastoid air cells, and bones are normal. Extracranial soft tissues are unremarkable. No subdural, epidural, or subarachnoid hemorrhage identified. Ventricles and sulci are prominent, likely due to age related atrophy. Scattered lacunar infarcts are seen, particularly in the basal ganglia, left greater than right. No acute cortical ischemia or infarct. Cerebellum is unremarkable. Basal cisterns are patent. A small lacunar infarct is suspected in the left side of the pons, likely old. The right transverse dural venous sinus is more prominent than the left. No acute cortical ischemia or infarct. CT CERVICAL SPINE FINDINGS Evaluation of the cervical spine is limited due to patient body habitus and surgical hardware. There is reversal of normal lordosis at C5-6. No other malalignment is identified. Within limitations, no fractures are seen. Pedicle screws are seen from C3 through C7. A rod then extends inferiorly spanning the level of T2 which demonstrates a severe compression fracture which is age indeterminate but favore to be chronic. No other acute abnormalities. IMPRESSION: 1. The right transverse dural venous sinus is much more prominent than the left. While this may be normal for this patient, there are no comparisons and it would be difficult to exclude dural venous thrombosis on this noncontrast head CT. If there is concern, an MRV could better evaluate. 2. No other acute intracranial abnormalities. 3. Evaluation of the cervical spine is limited due to surgical hardware and body habitus. No fractures or traumatic malalignment is identified. The reversal of normal lordosis at C5-6 is favored to be chronic. There is an age-indeterminate fracture through T2 with severe loss of height, also favored to be chronic. Findings are being called to the referring clinical  team. I spoke to Dr. Junita Push. Electronically Signed   By: Dorise Bullion III M.D   On: 06/25/2016 20:01   Ct Cervical Spine Wo Contrast  06/25/2016  CLINICAL DATA:  Patient found down. EXAM: CT HEAD WITHOUT CONTRAST CT CERVICAL SPINE WITHOUT CONTRAST TECHNIQUE: Multidetector CT imaging of the head and cervical spine was performed following the standard protocol without intravenous contrast. Multiplanar CT image reconstructions of the cervical spine were also generated. COMPARISON:  None. FINDINGS: CT HEAD FINDINGS Paranasal sinuses, mastoid air cells, and bones are normal. Extracranial soft tissues are unremarkable. No subdural, epidural, or subarachnoid hemorrhage identified. Ventricles and sulci are prominent, likely due to age related atrophy. Scattered lacunar infarcts are seen, particularly in the basal ganglia, left greater than right. No acute cortical ischemia or infarct. Cerebellum is unremarkable. Basal cisterns are patent. A small lacunar infarct  is suspected in the left side of the pons, likely old. The right transverse dural venous sinus is more prominent than the left. No acute cortical ischemia or infarct. CT CERVICAL SPINE FINDINGS Evaluation of the cervical spine is limited due to patient body habitus and surgical hardware. There is reversal of normal lordosis at C5-6. No other malalignment is identified. Within limitations, no fractures are seen. Pedicle screws are seen from C3 through C7. A rod then extends inferiorly spanning the level of T2 which demonstrates a severe compression fracture which is age indeterminate but favore to be chronic. No other acute abnormalities. IMPRESSION: 1. The right transverse dural venous sinus is much more prominent than the left. While this may be normal for this patient, there are no comparisons and it would be difficult to exclude dural venous thrombosis on this noncontrast head CT. If there is concern, an MRV could better evaluate. 2. No other acute  intracranial abnormalities. 3. Evaluation of the cervical spine is limited due to surgical hardware and body habitus. No fractures or traumatic malalignment is identified. The reversal of normal lordosis at C5-6 is favored to be chronic. There is an age-indeterminate fracture through T2 with severe loss of height, also favored to be chronic. Findings are being called to the referring clinical team. I spoke to Dr. Junita Push. Electronically Signed   By: Dorise Bullion III M.D   On: 06/25/2016 20:01   Mr Brain Wo Contrast  06/26/2016  CLINICAL DATA:  Confusion, slurred speech, and gait impairment with difficulty walking. History of lymphoma. EXAM: MRI HEAD WITHOUT CONTRAST MRA HEAD WITHOUT CONTRAST TECHNIQUE: Multiplanar, multiecho pulse sequences of the brain and surrounding structures were obtained without intravenous contrast. Angiographic images of the head were obtained using MRA technique without contrast. COMPARISON:  Head CT 06/25/2016 FINDINGS: MRI HEAD FINDINGS Some sequences are mildly motion degraded despite repeated imaging. There are multiple subcentimeter foci of restricted diffusion consistent with acute infarcts involving the midbrain bilaterally as well as pons. There is also the suggestion of punctate acute infarcts in the right occipital lobe and near the posterior aspect of the lentiform nucleus. No intracranial mass hemorrhage, mass, midline shift, or extra-axial fluid collection is identified. There are chronic lacunar infarcts involving the basal ganglia and deep white matter tracts bilaterally, right thalamus, and both cerebral hemispheres and middle cerebellar peduncles. A small chronic left occipital cortical infarct is also present. There is mild global cerebral atrophy. Orbits are unremarkable. Paranasal sinuses and mastoid air cells are clear. There is an abnormal appearance of the dominant distal left vertebral artery which may reflect slow flow or occlusion. The basilar artery is also  abnormal in appearance with evidence of narrowing and diminished flow, more fully evaluated below. Limited visualization of the cervical spine demonstrates susceptibility artifact from extensive posterior fusion. MRA HEAD FINDINGS The study is mildly to moderately motion degraded. Flow related enhancement is partially visualized in the V3 segments of the vertebral arteries, with the left vertebral artery dominant in size. No significant flow related enhancement is identified in the distal V3 or entirety of the V4 segments bilaterally. There is intermittent flow related enhancement in the PICAs. There is also only at most minimal intermittent flow related enhancement in the basilar artery. No significant enhancement is seen in the PCAs. Posterior communicating arteries are diminutive or absent. The internal carotid arteries are patent from skullbase to carotid termini. Evaluation for ICA stenosis is somewhat limited by motion though there is the suggestion of mild-to-moderate stenosis involving  the anterior genu and proximal supraclinoid segments bilaterally. ACAs and MCAs are patent with moderate branch vessel irregular narrowing bilaterally. There is also evidence of mild-to-moderate proximal right M1 and mid left A1 stenoses with possible right ACA origin stenosis as well. No sizable intracranial aneurysm is identified. IMPRESSION: 1. Multiple small acute posterior circulation infarcts with greatest involvement of the medulla and pons. 2. Multiple chronic posterior circulation infarcts as well as chronic bilateral basal ganglia/ deep white matter infarcts. 3. Motion degraded MRA. Minimal to no flow related enhancement in the distal vertebral arteries, basilar artery, and PCAs. This may reflect severely diminished flow from underlying high-grade stenoses (less likely complete occlusion given lack of large acute infarcts). Consider CTA or catheter angiography for further evaluation. These results will be called to  the ordering clinician or representative by the Radiologist Assistant, and communication documented in the PACS or zVision Dashboard. Electronically Signed   By: Logan Bores M.D.   On: 06/26/2016 12:45   US Carotid Bilateral  06/26/2016  CLINICAL DATA:  Transient ischemic attack. EXAM: BILATERAL CAROTID DUPLEX ULTRASOUND TECHNIQUE: Pearline Cables scale imaging, color Doppler and duplex ultrasound were performed of bilateral carotid and vertebral arteries in the neck. COMPARISON:  Ultrasound of September 13, 2014. FINDINGS: Criteria: Quantification of carotid stenosis is based on velocity parameters that correlate the residual internal carotid diameter with NASCET-based stenosis levels, using the diameter of the distal internal carotid lumen as the denominator for stenosis measurement. The following velocity measurements were obtained: RIGHT ICA:  109/34 cm/sec CCA:  99991111 cm/sec SYSTOLIC ICA/CCA RATIO:  1.0 DIASTOLIC ICA/CCA RATIO:  2.6 ECA:  128 cm/sec LEFT ICA:  92/27 cm/sec CCA:  0000000 cm/sec SYSTOLIC ICA/CCA RATIO:  0.9 DIASTOLIC ICA/CCA RATIO:  2.2 ECA:  211 cm/sec RIGHT CAROTID ARTERY: Mild calcified plaque formation is noted in the right carotid bulb and proximal right internal carotid artery consistent with less than 50% diameter stenosis based on ultrasound and Doppler criteria. RIGHT VERTEBRAL ARTERY:  Antegrade flow is noted. LEFT CAROTID ARTERY: Mild eccentric calcified plaque is noted in the left carotid bulb and proximal left internal carotid artery consistent with less than 50% diameter stenosis based on ultrasound and Doppler criteria. LEFT VERTEBRAL ARTERY:  Antegrade flow is noted. IMPRESSION: Mild calcified plaque is noted in both carotid bulbs and proximal internal carotid arteries consistent with less than 50% diameter stenosis based on ultrasound and Doppler criteria. This is not significantly changed compared to prior exam. Electronically Signed   By: Marijo Conception, M.D.   On: 06/26/2016 11:02    Dg Chest Port 1 View  06/26/2016  CLINICAL DATA:  70 year old male with pneumonia EXAM: PORTABLE CHEST 1 VIEW COMPARISON:  Chest radiograph dated 10/11/2013 FINDINGS: Single portable view of the chest demonstrates emphysematous changes of the lungs. There is no focal consolidation, pleural effusion, or pneumothorax. The cardiac silhouette is within normal limits. Coronary vascular calcification or stents noted. No acute osseous pathology. Lower cervical and upper thoracic posterior fixation hardware. IMPRESSION: No active disease. Electronically Signed   By: Anner Crete M.D.   On: 06/26/2016 02:41   Mr Jodene Nam Head/brain Wo Cm  06/26/2016  CLINICAL DATA:  Confusion, slurred speech, and gait impairment with difficulty walking. History of lymphoma. EXAM: MRI HEAD WITHOUT CONTRAST MRA HEAD WITHOUT CONTRAST TECHNIQUE: Multiplanar, multiecho pulse sequences of the brain and surrounding structures were obtained without intravenous contrast. Angiographic images of the head were obtained using MRA technique without contrast. COMPARISON:  Head CT 06/25/2016 FINDINGS: MRI HEAD  FINDINGS Some sequences are mildly motion degraded despite repeated imaging. There are multiple subcentimeter foci of restricted diffusion consistent with acute infarcts involving the midbrain bilaterally as well as pons. There is also the suggestion of punctate acute infarcts in the right occipital lobe and near the posterior aspect of the lentiform nucleus. No intracranial mass hemorrhage, mass, midline shift, or extra-axial fluid collection is identified. There are chronic lacunar infarcts involving the basal ganglia and deep white matter tracts bilaterally, right thalamus, and both cerebral hemispheres and middle cerebellar peduncles. A small chronic left occipital cortical infarct is also present. There is mild global cerebral atrophy. Orbits are unremarkable. Paranasal sinuses and mastoid air cells are clear. There is an abnormal  appearance of the dominant distal left vertebral artery which may reflect slow flow or occlusion. The basilar artery is also abnormal in appearance with evidence of narrowing and diminished flow, more fully evaluated below. Limited visualization of the cervical spine demonstrates susceptibility artifact from extensive posterior fusion. MRA HEAD FINDINGS The study is mildly to moderately motion degraded. Flow related enhancement is partially visualized in the V3 segments of the vertebral arteries, with the left vertebral artery dominant in size. No significant flow related enhancement is identified in the distal V3 or entirety of the V4 segments bilaterally. There is intermittent flow related enhancement in the PICAs. There is also only at most minimal intermittent flow related enhancement in the basilar artery. No significant enhancement is seen in the PCAs. Posterior communicating arteries are diminutive or absent. The internal carotid arteries are patent from skullbase to carotid termini. Evaluation for ICA stenosis is somewhat limited by motion though there is the suggestion of mild-to-moderate stenosis involving the anterior genu and proximal supraclinoid segments bilaterally. ACAs and MCAs are patent with moderate branch vessel irregular narrowing bilaterally. There is also evidence of mild-to-moderate proximal right M1 and mid left A1 stenoses with possible right ACA origin stenosis as well. No sizable intracranial aneurysm is identified. IMPRESSION: 1. Multiple small acute posterior circulation infarcts with greatest involvement of the medulla and pons. 2. Multiple chronic posterior circulation infarcts as well as chronic bilateral basal ganglia/ deep white matter infarcts. 3. Motion degraded MRA. Minimal to no flow related enhancement in the distal vertebral arteries, basilar artery, and PCAs. This may reflect severely diminished flow from underlying high-grade stenoses (less likely complete occlusion given  lack of large acute infarcts). Consider CTA or catheter angiography for further evaluation. These results will be called to the ordering clinician or representative by the Radiologist Assistant, and communication documented in the PACS or zVision Dashboard. Electronically Signed   By: Logan Bores M.D.   On: 06/26/2016 12:45    Assessment: 70 y.o. male presenting with syncope and confusion.  MRI of the brain personally reviewed and shows acute small bilateral posterior circulation infarcts.  MRA not clear.  Patient on ASA at home.  Carotid doppler shows no hemodynamically significant stenosis.  Remaining stroke work up pending.  Suspect etiology of small vessel disease.    Stroke Risk Factors - hyperlipidemia and hypertension  Plan: 1. HgbA1c, fasting lipid panel 2. CTA of the head and neck 3. PT consult, OT consult, Speech consult 4. Echocardiogram 5. Prophylactic therapy-Antiplatelet med: Plavix - dose 75 mg daily 6. NPO until RN stroke swallow screen 7. Telemetry monitoring 8. Frequent neuro checks   Alexis Goodell, MD Neurology [phone] 06/26/2016, 3:27 PM

## 2016-06-26 NOTE — Progress Notes (Signed)
PT Cancellation Note  Patient Details Name: ISAM ZANGER MRN: [id number] DOB: 01/06/1946   Cancelled Treatment:    Reason Eval/Treat Not Completed: Patient at procedure or test/unavailable. Patient is currently off the floor for [place], there was some concern of possible thrombosis on CT, will wait for results of MRI testing prior to mobility evaluation.   Kerman Passey, PT, DPT    06/26/2016, 10:55 AM

## 2016-06-26 NOTE — Care Management (Signed)
Patient lives with his daughter Juanetta Snow.   He is independent in all his adls and drives. He Spent 6 months at [facility] several years back for treatment of lymphoma.  HCPOA is Lennette Bihari [id number].  Will bring in document so can be scanned into Epic.  Patient has ruled in for CVA.  He is currently npo due to dysphagia and unable to speak.  He cries anytime converstaion is directed at him.  Discussed potential discharge options with patient and daughter Casie:  CIR- must meat criteria and must have strong discharge plan, SNF, Home with home health .  Obtained permission to have assessment begin for acute inpatient rehab criteria

## 2016-06-26 NOTE — ED Notes (Signed)
RN entered room to do stroke swallow screen, MD Preddy at bedside at this time. Will complete once consult is completed.

## 2016-06-26 NOTE — ED Notes (Signed)
MD at bedside. 

## 2016-06-26 NOTE — ED Notes (Signed)
Portable Xray at bedside at this time.

## 2016-06-26 NOTE — Care Management (Signed)
CM has made several attempts to assess patient and he has been off the unit for testing.  Brain MRI results pending

## 2016-06-26 NOTE — Evaluation (Signed)
Clinical/Bedside Swallow Evaluation Patient Details  Name: Kyle Choi MRN: [id number] Date of Birth: 1946/02/25  Today's Date: 06/26/2016 Time: SLP Start Time (ACUTE ONLY): 1045 SLP Stop Time (ACUTE ONLY): 1145 SLP Time Calculation (min) (ACUTE ONLY): 60 min  Past Medical History:  Past Medical History  Diagnosis Date  . Cancer ([place])   . Lymphoma ([place])   . Heart disease    Past Surgical History:  Past Surgical History  Procedure Laterality Date  . Cervical disc surgery      c1-7 [facility]  . Coronary angioplasty with stent placement     HPI:  Pt is a 70 y.o. male with a known history of Lymphoma, heart disease presented to the emergency room with confusion for the last 3 days. Patient was accompanied by family members, according to them patient has been confused and having slurred speech for the last 3 days. He also had impaired gait and difficulty walking. Patient usually is able to walk by himself and he has a walker at home. Currently, pt is resting in bed. He is alert/awake and attempting to communicate w/ SLP but speech was often mumbled, quick  and dysarthric(?). He was frequently labile (moderate-severe in degree). He denied being "sad" to [name]. Pt followed instructions given min. cues. Noted mild pharyngeal wetness at rest prior to any po's given; pt only throat cleared intermittently to the wetness.     Assessment / Plan / Recommendation Clinical Impression  Pt appeared to present w/ s/s of oropharyngeal phase dysphagia at this evaluation today. Pt exhibited a delayed cough response during trials of Nectar liquids - suspect delayed pharyngeal swallow initiation w/ laryngeal penetration and/or aspiration occurring. Of note, pt exhibited decreased ability to produce a cough and initial cough attempts were reduced in effort and ability to protect the airway. Also noted mild velopharyngeal incompetency during breathing and phonation. Pt's respiratory effort appeared mildly  uncoordinated/exagerated during exertion of the lability and assessment. Oral phase c/b decreased Left labial closure/weakness w/ min bolus residue noted at the Left corner. Pt exhibited mild, Left oral motor weakness during exam. With strict aspiration precautions, pt appeared able to adequately tolertae trials of single ice chips and small tsps of Puree w/ cues to use lingual sweeping and f/u swallow. No declined in pulmonary status or overt s/s of aspiration noted during these trials. MD consulted re: these results and agreed w/ initiation of pleasure trials of applesauce w/ NSG when swallowing Meds in applesauce; pt may also have few, single ice chips w/ NSG supervision and strict aspiration precautions. Recommend frequent oral care. ST will f/u w/ ongoing assessment of swallowing. NSG updated and agreed.     Aspiration Risk  Moderate aspiration risk    Diet Recommendation  NPO w/ pleasure bites of applesauce and single ice chips w/ NSG; strict aspiration precautions. MBSS to follow tomorrow.  Medication Administration: Crushed with puree    Other  Recommendations Recommended Consults:  (Dietician) Oral Care Recommendations: Oral care QID;Staff/trained caregiver to provide oral care Other Recommendations:  (TBD)   Follow up Recommendations   (TBD)    Frequency and Duration min 3x week  2 weeks       Prognosis Prognosis for Safe Diet Advancement: Fair Barriers to Reach Goals: Severity of deficits      Swallow Study   General Date of Onset: 06/25/16 HPI: Pt is a 70 y.o. male with a known history of Lymphoma, heart disease presented to the emergency room with confusion for the  last 3 days. Patient was accompanied by family members, according to them patient has been confused and having slurred speech for the last 3 days. He also had impaired gait and difficulty walking. Patient usually is able to walk by himself and he has a walker at home. Currently, pt is resting in bed. He is  alert/awake and attempting to communicate w/ SLP but speech was often mumbled, quick  and dysarthric(?). He was frequently labile (moderate-severe in degree). He denied being "sad" to [name]. Pt followed instructions given min. cues. Noted mild pharyngeal wetness at rest prior to any po's given; pt only throat cleared intermittently to the wetness.   Type of Study: Bedside Swallow Evaluation Previous Swallow Assessment: none indicated Diet Prior to this Study: Regular;Thin liquids (at home; NPO currently) Temperature Spikes Noted: No (wbc 14.2) Respiratory Status: Room air History of Recent Intubation: No Behavior/Cognition: Alert;Cooperative;Pleasant mood (Labile) Oral Cavity Assessment: Dry Oral Care Completed by SLP: Yes Oral Cavity - Dentition: Dentures, top (no bottom dentition) Vision: Functional for self-feeding Self-Feeding Abilities: Needs assist;Needs set up;Total assist (Left UE weakness) Patient Positioning: Upright in bed Baseline Vocal Quality: Low vocal intensity Volitional Cough:  (reduced in production; unable to coordinate volitionally) Volitional Swallow: Able to elicit    Oral/Motor/Sensory Function Overall Oral Motor/Sensory Function: Mild impairment Facial ROM: Reduced left Facial Symmetry: Abnormal symmetry left Facial Strength: Reduced left Facial Sensation: Reduced left Lingual ROM: Reduced left (slight) Lingual Symmetry: Abnormal symmetry left (slight) Lingual Strength: Reduced Velum: Impaired left Mandible: Within Functional Limits   Ice Chips Ice chips: Within functional limits Presentation: Spoon (6 trials; single ice chips)   Thin Liquid Thin Liquid: Not tested    Nectar Thick Nectar Thick Liquid: Impaired Presentation: Spoon (fed; 2 trials) Oral Phase Impairments: Reduced labial seal Oral phase functional implications: Left anterior spillage Pharyngeal Phase Impairments: Suspected delayed Swallow;Wet Vocal Quality;Cough - Delayed;Throat  Clearing - Delayed Other Comments: pt had difficulty initially attempting to initiate a sufficient cough response   Honey Thick Honey Thick Liquid: Not tested   Puree Puree: Impaired Presentation: Spoon (10 trials) Oral Phase Impairments: Reduced labial seal Oral Phase Functional Implications: Left anterior spillage (min) Pharyngeal Phase Impairments:  (none)   Solid   GO   Solid: Not tested       Orinda Kenner, MS, CCC-SLP  Meekah Math 06/26/2016,2:35 PM

## 2016-06-26 NOTE — ED Provider Notes (Signed)
Time Seen: Approximately *1940  I have reviewed the triage notes  Chief Complaint: Altered Mental Status   History of Present Illness: Kyle Choi is a 70 y.o. male who for the last couple of days according to the family is had a decreased appetite and has had episodes where he somewhat lethargic and confused and then on occasion will talk and not make sense. They're not aware of any fever. Patient himself denies any headaches or persistent nausea or vomiting. No obvious focal weakness in either upper or lower extremities.   Past Medical History  Diagnosis Date  . Cancer [facility])     Patient Active Problem List   Diagnosis Date Noted  . CAD in native artery 06/20/2015  . Lymphoma in remission ([place]) 06/20/2015  . Abnormal LFTs 06/20/2015  . Essential (primary) hypertension 06/20/2015  . Hypothyroidism 06/20/2015  . 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 Cervical disc surgery      c1-7 [facility]  . Coronary angioplasty with stent placement      Past Surgical History  Procedure Laterality Date  . Cervical disc surgery      c1-7 [facility]  . Coronary angioplasty with stent placement      Current Outpatient Rx  Name  Route  Sig  Dispense  Refill  . aspirin 81 MG tablet   Oral   Take 1 tablet by mouth daily.         Marland Kitchen atorvastatin (LIPITOR) 40 MG tablet      TAKE ONE TABLET BY MOUTH ONCE DAILY   30 tablet   0   . levothyroxine (SYNTHROID, LEVOTHROID) 100 MCG tablet      TAKE ONE TABLET BY MOUTH ONCE DAILY   30 tablet   5   . losartan (COZAAR) 50 MG tablet      TAKE ONE TABLET BY MOUTH ONCE DAILY   30 tablet   5   . metoprolol tartrate (LOPRESSOR) 25 MG tablet   Oral   Take 0.5 tablets (12.5 mg total) by mouth 2 (two) times daily.   30 tablet   5   . potassium chloride 20 MEQ/15ML (10%) SOLN   Oral   Take 7.5 mLs (10 mEq total) by mouth 2 (two) times daily.   450 mL   5      Allergies:  Ace inhibitors  Family History: Family History  Problem Relation Age of Onset  . Heart failure Mother   . Brain cancer Brother     Social History: Social History  Substance Use Topics  . Smoking status: Former Research scientist (life sciences)  . Smokeless tobacco: None  . Alcohol Use: No     Review of Systems:   10 point review of systems was performed and was otherwise negative:  Constitutional: No fever Eyes: No visual disturbances ENT: No sore throat, ear pain Cardiac: No chest pain Respiratory: No shortness of breath, wheezing, or stridor Abdomen: No abdominal pain, no vomiting, No diarrhea Endocrine: No weight loss, No night sweats Extremities: No peripheral edema, cyanosis Skin: No rashes, easy bruising Neurologic: No focal weakness, trouble with speech or swollowing Urologic: No dysuria, Hematuria, or urinary frequency   Physical Exam:  ED Triage Vitals  Enc Vitals Group     BP 06/25/16 1832 151/82 mmHg     Pulse Rate 06/25/16 1832 101     Resp 06/25/16 1832 18     Temp 06/25/16 1832 98.1 F (36.7  C)     Temp Source 06/25/16 1832 Oral     SpO2 06/25/16 1832 98 %     Weight 06/25/16 1832 170 lb (77.111 kg)     Height 06/25/16 1832 5\' 2"  (1.575 m)     Head Cir --      Peak Flow --      Pain Score --      Pain Loc --      Pain Edu? --      Excl. in [place]? --     General: Awake , Alert , and Oriented times 3; GCS 15Patient is cooperative and answers most questions appropriately and then will have tangential speech Head: Normal cephalic , atraumatic. No obvious facial weakness Eyes: Pupils equal , round, reactive to light Nose/Throat: No nasal drainage, patent upper airway without erythema or exudate.  Neck: Supple, Full range of motion, No anterior adenopathy or palpable thyroid masses Lungs: Clear to ascultation without wheezes , rhonchi, or rales Heart: Regular rate, regular rhythm without murmurs , gallops , or rubs Abdomen: Soft, non tender without rebound,  guarding , or rigidity; bowel sounds positive and symmetric in all 4 quadrants. No organomegaly .        Extremities: 2 plus symmetric pulses. No edema, clubbing or cyanosis Neurologic: Motor is 4 out of 5 in both upper and lower extremity on the right 3 out of 5 on the left upper extremity 4 out of 5 left lower extremity. Sensory appears to be intact negative drift Skin: warm, dry, no rashes   Labs:   All laboratory work was reviewed including any pertinent negatives or positives listed below:  Labs Reviewed  COMPREHENSIVE METABOLIC PANEL - Abnormal; Notable for the following:    Chloride 99 (*)    Glucose, Bld 108 (*)    Creatinine, Ser 1.28 (*)    ALT 14 (*)    Total Bilirubin 1.6 (*)    GFR calc non Af Amer 55 (*)    All other components within normal limits  CBC - Abnormal; Notable for the following:    WBC 17.8 (*)    RDW 14.8 (*)    All other components within normal limits  URINALYSIS COMPLETEWITH MICROSCOPIC ([facility] ONLY) - Abnormal; Notable for the following:    Color, Urine YELLOW (*)    APPearance CLEAR (*)    Ketones, ur 1+ (*)    Squamous Epithelial / LPF 0-5 (*)    All other components within normal limits  TROPONIN I - Abnormal; Notable for the following:    Troponin I 0.08 (*)    All other components within normal limits  CBG MONITORING, ED   Of significance is no elevated white blood cell count and troponin level. EKG: *   ED ECG REPORT I, Daymon Larsen, the attending physician, personally viewed and interpreted this ECG.  Date: 06/26/2016 EKG Time: 1846 Rate: 97 Rhythm: normal sinus rhythm QRS Axis: normal Intervals: normal ST/T Wave abnormalities: normal Conduction Disturbances: none Narrative Interpretation: unremarkable QRS complexes noticed inferiorly but otherwise no acute ischemic changes   Radiology: *     CT Cervical Spine Wo Contrast (Final result) Result time: 06/25/16 20:01:52   Final result by Rad Results In Interface (06/25/16  20:01:52)   Narrative:   CLINICAL DATA: Patient found down.  EXAM: CT HEAD WITHOUT CONTRAST  CT CERVICAL SPINE WITHOUT CONTRAST  TECHNIQUE: Multidetector CT imaging of the head and cervical spine was performed following the standard protocol without intravenous contrast.  Multiplanar CT image reconstructions of the cervical spine were also generated.  COMPARISON: None.  FINDINGS: CT HEAD FINDINGS  Paranasal sinuses, mastoid air cells, and bones are normal. Extracranial soft tissues are unremarkable. No subdural, epidural, or subarachnoid hemorrhage identified. Ventricles and sulci are prominent, likely due to age related atrophy. Scattered lacunar infarcts are seen, particularly in the basal ganglia, left greater than right. No acute cortical ischemia or infarct. Cerebellum is unremarkable. Basal cisterns are patent. A small lacunar infarct is suspected in the left side of the pons, likely old. The right transverse dural venous sinus is more prominent than the left. No acute cortical ischemia or infarct.  CT CERVICAL SPINE FINDINGS  Evaluation of the cervical spine is limited due to patient body habitus and surgical hardware. There is reversal of normal lordosis at C5-6. No other malalignment is identified. Within limitations, no fractures are seen. Pedicle screws are seen from C3 through C7. A rod then extends inferiorly spanning the level of T2 which demonstrates a severe compression fracture which is age indeterminate but favore to be chronic. No other acute abnormalities.  IMPRESSION: 1. The right transverse dural venous sinus is much more prominent than the left. While this may be normal for this patient, there are no comparisons and it would be difficult to exclude dural venous thrombosis on this noncontrast head CT. If there is concern, an MRV could better evaluate. 2. No other acute intracranial abnormalities. 3. Evaluation of the cervical spine is limited  due to surgical hardware and body habitus. No fractures or traumatic malalignment is identified. The reversal of normal lordosis at C5-6 is favored to be chronic. There is an age-indeterminate fracture through T2 with severe loss of height, also favored to be chronic.  Findings are being called to the referring clinical team. I spoke to Dr. Junita Push.   Electronically Signed By: Dorise Bullion III M.D On: 06/25/2016 20:01          CT HEAD WO CONTRAST (Final result) Result time: 06/25/16 20:01:52   Procedure changed from [place]      Final result by Rad Results In Interface (06/25/16 20:01:52)   Narrative:   CLINICAL DATA: Patient found down.  EXAM: CT HEAD WITHOUT CONTRAST  CT CERVICAL SPINE WITHOUT CONTRAST  TECHNIQUE: Multidetector CT imaging of the head and cervical spine was performed following the standard protocol without intravenous contrast. Multiplanar CT image reconstructions of the cervical spine were also generated.  COMPARISON: None.  FINDINGS: CT HEAD FINDINGS  Paranasal sinuses, mastoid air cells, and bones are normal. Extracranial soft tissues are unremarkable. No subdural, epidural, or subarachnoid hemorrhage identified. Ventricles and sulci are prominent, likely due to age related atrophy. Scattered lacunar infarcts are seen, particularly in the basal ganglia, left greater than right. No acute cortical ischemia or infarct. Cerebellum is unremarkable. Basal cisterns are patent. A small lacunar infarct is suspected in the left side of the pons, likely old. The right transverse dural venous sinus is more prominent than the left. No acute cortical ischemia or infarct.  CT CERVICAL SPINE FINDINGS  Evaluation of the cervical spine is limited due to patient body habitus and surgical hardware. There is reversal of normal lordosis at C5-6. No other malalignment is identified. Within limitations, no fractures are seen. Pedicle  screws are seen from C3 through C7. A rod then extends inferiorly spanning the level of T2 which demonstrates a severe compression fracture which is age indeterminate but favore to be chronic. No other acute  abnormalities.  IMPRESSION: 1. The right transverse dural venous sinus is much more prominent than the left. While this may be normal for this patient, there are no comparisons and it would be difficult to exclude dural venous thrombosis on this noncontrast head CT. If there is concern, an MRV could better evaluate. 2. No other acute intracranial abnormalities. 3. Evaluation of the cervical spine is limited due to surgical hardware and body habitus. No fractures or traumatic malalignment is identified. The reversal of normal lordosis at C5-6 is favored to be chronic. There is an age-indeterminate fracture through T2 with severe loss of height, also favored to be chronic.  Findings are being called to the referring clinical team. I spoke to Dr. Junita Push.   Electronically Signed By: Dorise Bullion III M.D On: 06/25/2016 20:01         I personally reviewed the radiologic studies     ED Course:  Patient's stay here was uneventful and is since he was found down by family we've performed a cervical CT which did not show any obvious significant findings. His head CT shows some concern for possible cavernous venous thrombosis and may require MRI evaluation. Patient's troponin now will also need to be followed since he had no elevated troponin on first testing. His EKG shows no acute ischemic change and certainly no history of chest pain, shortness of breath, nausea or vomiting etc.    Assessment: Altered mental status Elevated troponin of unknown significance*   Final Clinical Impression:   Final diagnoses:  Altered mental status, unspecified altered mental status type     Plan:  Inpatient management            Daymon Larsen, MD 06/26/16 [phone]

## 2016-06-26 NOTE — Progress Notes (Signed)
Informed Dr. Anselm Jungling of MRI results. Positive for multiple small acute infarcts as well as minimal to no flow in some arteries. Neurology consult is pending.

## 2016-06-26 NOTE — Progress Notes (Signed)
[facility] NAME: Kyle Choi    MR#:  [id number]  DATE OF BIRTH:  1946-10-22  SUBJECTIVE:  CHIEF COMPLAINT:   Chief Complaint  Patient presents with  . Altered Mental Status   Came with speech problem and left sided weakness for last 2-3 days.  Mood is labile- ans he is crying inbetween.  REVIEW OF SYSTEMS:  CONSTITUTIONAL: No fever, fatigue or weakness.  EYES: No blurred or double vision.  EARS, NOSE, AND THROAT: No tinnitus or ear pain.  RESPIRATORY: No cough, shortness of breath, wheezing or hemoptysis.  CARDIOVASCULAR: No chest pain, orthopnea, edema.  GASTROINTESTINAL: No nausea, vomiting, diarrhea or abdominal pain.  GENITOURINARY: No dysuria, hematuria.  ENDOCRINE: No polyuria, nocturia,  HEMATOLOGY: No anemia, easy bruising or bleeding SKIN: No rash or lesion. MUSCULOSKELETAL: No joint pain or arthritis.   NEUROLOGIC: No tingling, numbness, left sided weakness.  PSYCHIATRY: No anxiety or depression.   ROS  DRUG ALLERGIES:   Allergies  Allergen Reactions  . Ace Inhibitors Cough    VITALS:  Blood pressure 133/58, pulse 73, temperature 98.7 F (37.1 C), temperature source Oral, resp. rate 20, height 5\' 2"  (1.575 m), weight 72.757 kg (160 lb 6.4 oz), SpO2 95 %.  PHYSICAL EXAMINATION:  GENERAL:  70 y.o.-year-old patient lying in the bed with no acute distress.  EYES: Pupils equal, round, reactive to light and accommodation. No scleral icterus. Extraocular muscles intact.  HEENT: Head atraumatic, normocephalic. Oropharynx and nasopharynx clear.  NECK:  Supple, no jugular venous distention. No thyroid enlargement, no tenderness.  LUNGS: Normal breath sounds bilaterally, no wheezing, rales,rhonchi or crepitation. No use of accessory muscles of respiration.  CARDIOVASCULAR: S1, S2 normal. No murmurs, rubs, or gallops.  ABDOMEN: Soft, nontender, nondistended. Bowel sounds present. No organomegaly or mass.   EXTREMITIES: No pedal edema, cyanosis, or clubbing.  NEUROLOGIC: Cranial nerves II through XII are intact. Muscle strength 5/5 in right sided extremities, left side 3-4/5 power. Sensation intact. Gait not checked.  PSYCHIATRIC: The patient is alert and oriented x 3.  SKIN: No obvious rash, lesion, or ulcer.   Physical Exam LABORATORY PANEL:   CBC  Recent Labs Lab 06/26/16 0534  WBC 14.2*  HGB 14.2  HCT 42.4  PLT 263   ------------------------------------------------------------------------------------------------------------------  Chemistries   Recent Labs Lab 06/25/16 1840 06/26/16 0534  NA 135  --   K 4.0  --   CL 99*  --   CO2 25  --   GLUCOSE 108*  --   BUN 16  --   CREATININE 1.28* 1.29*  CALCIUM 9.4  --   AST 22  --   ALT 14*  --   ALKPHOS 114  --   BILITOT 1.6*  --    ------------------------------------------------------------------------------------------------------------------  Cardiac Enzymes  Recent Labs Lab 06/25/16 1840  TROPONINI 0.08*   ------------------------------------------------------------------------------------------------------------------  RADIOLOGY:  Ct Head Wo Contrast  06/25/2016  CLINICAL DATA:  Patient found down. EXAM: CT HEAD WITHOUT CONTRAST CT CERVICAL SPINE WITHOUT CONTRAST TECHNIQUE: Multidetector CT imaging of the head and cervical spine was performed following the standard protocol without intravenous contrast. Multiplanar CT image reconstructions of the cervical spine were also generated. COMPARISON:  None. FINDINGS: CT HEAD FINDINGS Paranasal sinuses, mastoid air cells, and bones are normal. Extracranial soft tissues are unremarkable. No subdural, epidural, or subarachnoid hemorrhage identified. Ventricles and sulci are prominent, likely due to age related atrophy. Scattered lacunar infarcts are seen, particularly in the basal ganglia, left  greater than right. No acute cortical ischemia or infarct. Cerebellum is  unremarkable. Basal cisterns are patent. A small lacunar infarct is suspected in the left side of the pons, likely old. The right transverse dural venous sinus is more prominent than the left. No acute cortical ischemia or infarct. CT CERVICAL SPINE FINDINGS Evaluation of the cervical spine is limited due to patient body habitus and surgical hardware. There is reversal of normal lordosis at C5-6. No other malalignment is identified. Within limitations, no fractures are seen. Pedicle screws are seen from C3 through C7. A rod then extends inferiorly spanning the level of T2 which demonstrates a severe compression fracture which is age indeterminate but favore to be chronic. No other acute abnormalities. IMPRESSION: 1. The right transverse dural venous sinus is much more prominent than the left. While this may be normal for this patient, there are no comparisons and it would be difficult to exclude dural venous thrombosis on this noncontrast head CT. If there is concern, an MRV could better evaluate. 2. No other acute intracranial abnormalities. 3. Evaluation of the cervical spine is limited due to surgical hardware and body habitus. No fractures or traumatic malalignment is identified. The reversal of normal lordosis at C5-6 is favored to be chronic. There is an age-indeterminate fracture through T2 with severe loss of height, also favored to be chronic. Findings are being called to the referring clinical team. I spoke to Dr. Junita Push. Electronically Signed   By: Dorise Bullion III M.D   On: 06/25/2016 20:01   Ct Cervical Spine Wo Contrast  06/25/2016  CLINICAL DATA:  Patient found down. EXAM: CT HEAD WITHOUT CONTRAST CT CERVICAL SPINE WITHOUT CONTRAST TECHNIQUE: Multidetector CT imaging of the head and cervical spine was performed following the standard protocol without intravenous contrast. Multiplanar CT image reconstructions of the cervical spine were also generated. COMPARISON:  None. FINDINGS: CT HEAD  FINDINGS Paranasal sinuses, mastoid air cells, and bones are normal. Extracranial soft tissues are unremarkable. No subdural, epidural, or subarachnoid hemorrhage identified. Ventricles and sulci are prominent, likely due to age related atrophy. Scattered lacunar infarcts are seen, particularly in the basal ganglia, left greater than right. No acute cortical ischemia or infarct. Cerebellum is unremarkable. Basal cisterns are patent. A small lacunar infarct is suspected in the left side of the pons, likely old. The right transverse dural venous sinus is more prominent than the left. No acute cortical ischemia or infarct. CT CERVICAL SPINE FINDINGS Evaluation of the cervical spine is limited due to patient body habitus and surgical hardware. There is reversal of normal lordosis at C5-6. No other malalignment is identified. Within limitations, no fractures are seen. Pedicle screws are seen from C3 through C7. A rod then extends inferiorly spanning the level of T2 which demonstrates a severe compression fracture which is age indeterminate but favore to be chronic. No other acute abnormalities. IMPRESSION: 1. The right transverse dural venous sinus is much more prominent than the left. While this may be normal for this patient, there are no comparisons and it would be difficult to exclude dural venous thrombosis on this noncontrast head CT. If there is concern, an MRV could better evaluate. 2. No other acute intracranial abnormalities. 3. Evaluation of the cervical spine is limited due to surgical hardware and body habitus. No fractures or traumatic malalignment is identified. The reversal of normal lordosis at C5-6 is favored to be chronic. There is an age-indeterminate fracture through T2 with severe loss of height, also favored to be  chronic. Findings are being called to the referring clinical team. I spoke to Dr. Junita Push. Electronically Signed   By: Dorise Bullion III M.D   On: 06/25/2016 20:01   Mr Brain Wo  Contrast  06/26/2016  CLINICAL DATA:  Confusion, slurred speech, and gait impairment with difficulty walking. History of lymphoma. EXAM: MRI HEAD WITHOUT CONTRAST MRA HEAD WITHOUT CONTRAST TECHNIQUE: Multiplanar, multiecho pulse sequences of the brain and surrounding structures were obtained without intravenous contrast. Angiographic images of the head were obtained using MRA technique without contrast. COMPARISON:  Head CT 06/25/2016 FINDINGS: MRI HEAD FINDINGS Some sequences are mildly motion degraded despite repeated imaging. There are multiple subcentimeter foci of restricted diffusion consistent with acute infarcts involving the midbrain bilaterally as well as pons. There is also the suggestion of punctate acute infarcts in the right occipital lobe and near the posterior aspect of the lentiform nucleus. No intracranial mass hemorrhage, mass, midline shift, or extra-axial fluid collection is identified. There are chronic lacunar infarcts involving the basal ganglia and deep white matter tracts bilaterally, right thalamus, and both cerebral hemispheres and middle cerebellar peduncles. A small chronic left occipital cortical infarct is also present. There is mild global cerebral atrophy. Orbits are unremarkable. Paranasal sinuses and mastoid air cells are clear. There is an abnormal appearance of the dominant distal left vertebral artery which may reflect slow flow or occlusion. The basilar artery is also abnormal in appearance with evidence of narrowing and diminished flow, more fully evaluated below. Limited visualization of the cervical spine demonstrates susceptibility artifact from extensive posterior fusion. MRA HEAD FINDINGS The study is mildly to moderately motion degraded. Flow related enhancement is partially visualized in the V3 segments of the vertebral arteries, with the left vertebral artery dominant in size. No significant flow related enhancement is identified in the distal V3 or entirety of the  V4 segments bilaterally. There is intermittent flow related enhancement in the PICAs. There is also only at most minimal intermittent flow related enhancement in the basilar artery. No significant enhancement is seen in the PCAs. Posterior communicating arteries are diminutive or absent. The internal carotid arteries are patent from skullbase to carotid termini. Evaluation for ICA stenosis is somewhat limited by motion though there is the suggestion of mild-to-moderate stenosis involving the anterior genu and proximal supraclinoid segments bilaterally. ACAs and MCAs are patent with moderate branch vessel irregular narrowing bilaterally. There is also evidence of mild-to-moderate proximal right M1 and mid left A1 stenoses with possible right ACA origin stenosis as well. No sizable intracranial aneurysm is identified. IMPRESSION: 1. Multiple small acute posterior circulation infarcts with greatest involvement of the medulla and pons. 2. Multiple chronic posterior circulation infarcts as well as chronic bilateral basal ganglia/ deep white matter infarcts. 3. Motion degraded MRA. Minimal to no flow related enhancement in the distal vertebral arteries, basilar artery, and PCAs. This may reflect severely diminished flow from underlying high-grade stenoses (less likely complete occlusion given lack of large acute infarcts). Consider CTA or catheter angiography for further evaluation. These results will be called to the ordering clinician or representative by the Radiologist Assistant, and communication documented in the PACS or zVision Dashboard. Electronically Signed   By: Logan Bores M.D.   On: 06/26/2016 12:45   US Carotid Bilateral  06/26/2016  CLINICAL DATA:  Transient ischemic attack. EXAM: BILATERAL CAROTID DUPLEX ULTRASOUND TECHNIQUE: Pearline Cables scale imaging, color Doppler and duplex ultrasound were performed of bilateral carotid and vertebral arteries in the neck. COMPARISON:  Ultrasound of September 13, 2014.  FINDINGS: Criteria: Quantification of carotid stenosis is based on velocity parameters that correlate the residual internal carotid diameter with NASCET-based stenosis levels, using the diameter of the distal internal carotid lumen as the denominator for stenosis measurement. The following velocity measurements were obtained: RIGHT ICA:  109/34 cm/sec CCA:  99991111 cm/sec SYSTOLIC ICA/CCA RATIO:  1.0 DIASTOLIC ICA/CCA RATIO:  2.6 ECA:  128 cm/sec LEFT ICA:  92/27 cm/sec CCA:  0000000 cm/sec SYSTOLIC ICA/CCA RATIO:  0.9 DIASTOLIC ICA/CCA RATIO:  2.2 ECA:  211 cm/sec RIGHT CAROTID ARTERY: Mild calcified plaque formation is noted in the right carotid bulb and proximal right internal carotid artery consistent with less than 50% diameter stenosis based on ultrasound and Doppler criteria. RIGHT VERTEBRAL ARTERY:  Antegrade flow is noted. LEFT CAROTID ARTERY: Mild eccentric calcified plaque is noted in the left carotid bulb and proximal left internal carotid artery consistent with less than 50% diameter stenosis based on ultrasound and Doppler criteria. LEFT VERTEBRAL ARTERY:  Antegrade flow is noted. IMPRESSION: Mild calcified plaque is noted in both carotid bulbs and proximal internal carotid arteries consistent with less than 50% diameter stenosis based on ultrasound and Doppler criteria. This is not significantly changed compared to prior exam. Electronically Signed   By: Marijo Conception, M.D.   On: 06/26/2016 11:02   Dg Chest Port 1 View  06/26/2016  CLINICAL DATA:  70 year old male with pneumonia EXAM: PORTABLE CHEST 1 VIEW COMPARISON:  Chest radiograph dated 10/11/2013 FINDINGS: Single portable view of the chest demonstrates emphysematous changes of the lungs. There is no focal consolidation, pleural effusion, or pneumothorax. The cardiac silhouette is within normal limits. Coronary vascular calcification or stents noted. No acute osseous pathology. Lower cervical and upper thoracic posterior fixation hardware.  IMPRESSION: No active disease. Electronically Signed   By: Anner Crete M.D.   On: 06/26/2016 02:41   Mr Jodene Nam Head/brain Wo Cm  06/26/2016  CLINICAL DATA:  Confusion, slurred speech, and gait impairment with difficulty walking. History of lymphoma. EXAM: MRI HEAD WITHOUT CONTRAST MRA HEAD WITHOUT CONTRAST TECHNIQUE: Multiplanar, multiecho pulse sequences of the brain and surrounding structures were obtained without intravenous contrast. Angiographic images of the head were obtained using MRA technique without contrast. COMPARISON:  Head CT 06/25/2016 FINDINGS: MRI HEAD FINDINGS Some sequences are mildly motion degraded despite repeated imaging. There are multiple subcentimeter foci of restricted diffusion consistent with acute infarcts involving the midbrain bilaterally as well as pons. There is also the suggestion of punctate acute infarcts in the right occipital lobe and near the posterior aspect of the lentiform nucleus. No intracranial mass hemorrhage, mass, midline shift, or extra-axial fluid collection is identified. There are chronic lacunar infarcts involving the basal ganglia and deep white matter tracts bilaterally, right thalamus, and both cerebral hemispheres and middle cerebellar peduncles. A small chronic left occipital cortical infarct is also present. There is mild global cerebral atrophy. Orbits are unremarkable. Paranasal sinuses and mastoid air cells are clear. There is an abnormal appearance of the dominant distal left vertebral artery which may reflect slow flow or occlusion. The basilar artery is also abnormal in appearance with evidence of narrowing and diminished flow, more fully evaluated below. Limited visualization of the cervical spine demonstrates susceptibility artifact from extensive posterior fusion. MRA HEAD FINDINGS The study is mildly to moderately motion degraded. Flow related enhancement is partially visualized in the V3 segments of the vertebral arteries, with the left  vertebral artery dominant in size. No significant flow related enhancement is identified in  the distal V3 or entirety of the V4 segments bilaterally. There is intermittent flow related enhancement in the PICAs. There is also only at most minimal intermittent flow related enhancement in the basilar artery. No significant enhancement is seen in the PCAs. Posterior communicating arteries are diminutive or absent. The internal carotid arteries are patent from skullbase to carotid termini. Evaluation for ICA stenosis is somewhat limited by motion though there is the suggestion of mild-to-moderate stenosis involving the anterior genu and proximal supraclinoid segments bilaterally. ACAs and MCAs are patent with moderate branch vessel irregular narrowing bilaterally. There is also evidence of mild-to-moderate proximal right M1 and mid left A1 stenoses with possible right ACA origin stenosis as well. No sizable intracranial aneurysm is identified. IMPRESSION: 1. Multiple small acute posterior circulation infarcts with greatest involvement of the medulla and pons. 2. Multiple chronic posterior circulation infarcts as well as chronic bilateral basal ganglia/ deep white matter infarcts. 3. Motion degraded MRA. Minimal to no flow related enhancement in the distal vertebral arteries, basilar artery, and PCAs. This may reflect severely diminished flow from underlying high-grade stenoses (less likely complete occlusion given lack of large acute infarcts). Consider CTA or catheter angiography for further evaluation. These results will be called to the ordering clinician or representative by the Radiologist Assistant, and communication documented in the PACS or zVision Dashboard. Electronically Signed   By: Logan Bores M.D.   On: 06/26/2016 12:45    ASSESSMENT AND PLAN:   Principal Problem:   Altered mental status  * Acute stroke   Stroke work ups.   MR angio brain.   ASA, Statin   Carotid doppler and Echocardiogram.    Neurology consult  * Dysphagia   Speech consult.   MBSS tomorrow   Keep NPO except meds with apple sauce.  * Hypertension   Stable.  * Hypothyroidism   Levothyroxine  * Hyperlipidemia   Cont statin  All the records are reviewed and case discussed with Care Management/Social Workerr. Management plans discussed with the patient, family and they are in agreement.  CODE STATUS: Full  TOTAL TIME TAKING CARE OF THIS PATIENT: 35 minutes.     POSSIBLE D/C IN 1-2 DAYS, DEPENDING ON CLINICAL CONDITION.   Vaughan Basta M.D on 06/26/2016   Between 7am to 6pm - Pager - [phone]  After 6pm go to [email or web address] - password [email or web address] [facility]  Office  [phone]  CC: Primary care physician; Halina Maidens, MD  Note: This dictation was prepared with Dragon dictation along with smaller phrase technology. Any transcriptional errors that result from this process are unintentional.

## 2016-06-26 NOTE — Progress Notes (Signed)
OT Cancellation Note  Patient Details Name: Kyle Choi MRN: [id number] DOB: 05-04-1946   Cancelled Treatment:    Reason Eval/Treat Not Completed: Patient at procedure or test/ unavailable Eval attempted but patient is currently off the floor for [place], there was some concern of possible thrombosis on CT, will wait for results of MRI testing prior to ADL evaluation.    Chrys Racer, OTR/L ascom [phone] 06/26/2016, 12:03 PM

## 2016-06-26 NOTE — H&P (Signed)
Kyle Choi at [place] NAME: Kyle Choi    MR#:  [id number]  DATE OF BIRTH:  04-21-1946  DATE OF ADMISSION:  06/25/2016  PRIMARY CARE PHYSICIAN: Halina Maidens, MD   REQUESTING/REFERRING PHYSICIAN:   CHIEF COMPLAINT:   Chief Complaint  Patient presents with  . Altered Mental Status    HISTORY OF PRESENT ILLNESS: Kyle Choi  is a 70 y.o. male with a known history of Lymphoma, heart disease presented to the emergency room with confusion for the last 3 days. Patient was accompanied by family members, according to them patient has been confused and having slurred speech for the last 3 days. He also had impaired gait and difficulty walking. Patient usually is able to walk by himself and he has a walker at home. No history of any fall or head injury. No history of any seizure. Patient never had any stroke in the past according to family members. Patient was evaluated in the emergency room with a CT head which showed no acute intracranial abnormality. He had an elevated WBC count but urine did not show any infection. Hospitalist service was consulted for further care of the patient. Patient is awake not completely oriented to time place and person. Not able to give much history.  PAST MEDICAL HISTORY:   Past Medical History  Diagnosis Date  . Cancer ([place])   . Lymphoma ([place])   . Heart disease     PAST SURGICAL HISTORY: Past Surgical History  Procedure Laterality Date  . Cervical disc surgery      c1-7 [facility]  . Coronary angioplasty with stent placement      SOCIAL HISTORY:  Social History  Substance Use Topics  . Smoking status: Former Research scientist (life sciences)  . Smokeless tobacco: Not on file  . Alcohol Use: No    FAMILY HISTORY:  Family History  Problem Relation Age of Onset  . Heart failure Mother   . Brain cancer Brother     DRUG ALLERGIES:  Allergies  Allergen Reactions  . Ace Inhibitors Cough    REVIEW OF SYSTEMS:  Unable to give  review of systems secondary to confusion.  MEDICATIONS AT HOME:  Prior to Admission medications   Medication Sig Start Date End Date Taking? Authorizing Provider  aspirin 81 MG tablet Take 1 tablet by mouth daily.   Yes Historical Provider, MD  atorvastatin (LIPITOR) 40 MG tablet TAKE ONE TABLET BY MOUTH ONCE DAILY 11/05/15  Yes Glean Hess, MD  levothyroxine (SYNTHROID, LEVOTHROID) 100 MCG tablet TAKE ONE TABLET BY MOUTH ONCE DAILY 02/08/16  Yes Glean Hess, MD  losartan (COZAAR) 50 MG tablet TAKE ONE TABLET BY MOUTH ONCE DAILY 02/08/16  Yes Glean Hess, MD  metoprolol tartrate (LOPRESSOR) 25 MG tablet Take 0.5 tablets (12.5 mg total) by mouth 2 (two) times daily. 05/30/16  Yes Glean Hess, MD  potassium chloride 20 MEQ/15ML (10%) SOLN Take 7.5 mLs (10 mEq total) by mouth 2 (two) times daily. 02/21/16  Yes Glean Hess, MD      PHYSICAL EXAMINATION:   VITAL SIGNS: Blood pressure 127/72, pulse 87, temperature 98.1 F (36.7 C), temperature source Oral, resp. rate 18, height 5\' 2"  (1.575 m), weight 77.111 kg (170 lb), SpO2 96 %.  GENERAL:  70 y.o.-year-old patient lying in the bed in mild distress.  EYES: Pupils equal, round, reactive to light and accommodation. No scleral icterus. Extraocular muscles intact.  HEENT: Head atraumatic, normocephalic. Oropharynx dry and nasopharynx  clear.  NECK:  Supple, no jugular venous distention. No thyroid enlargement, no tenderness.  LUNGS: Normal breath sounds bilaterally, no wheezing, rales,rhonchi or crepitation. No use of accessory muscles of respiration.  CARDIOVASCULAR: S1, S2 normal. No murmurs, rubs, or gallops.  ABDOMEN: Soft, nontender, nondistended. Bowel sounds present. No organomegaly or mass. Power 5/5 all extremities, sensory system intact. EXTREMITIES: No pedal edema, cyanosis, or clubbing.  NEUROLOGIC: Not oriented to time,place and person. Moves all extremities. PSYCHIATRIC: could not be assessed. SKIN: No obvious rash,  lesion, or ulcer.   LABORATORY PANEL:   CBC  Recent Labs Lab 06/25/16 1840  WBC 17.8*  HGB 16.0  HCT 48.4  PLT 316  MCV 90.5  MCH 30.0  MCHC 33.1  RDW 14.8*   ------------------------------------------------------------------------------------------------------------------  Chemistries   Recent Labs Lab 06/25/16 1840  NA 135  K 4.0  CL 99*  CO2 25  GLUCOSE 108*  BUN 16  CREATININE 1.28*  CALCIUM 9.4  AST 22  ALT 14*  ALKPHOS 114  BILITOT 1.6*   ------------------------------------------------------------------------------------------------------------------ estimated creatinine clearance is 49 mL/min (by C-G formula based on Cr of 1.28). ------------------------------------------------------------------------------------------------------------------ No results for input(s): TSH, T4TOTAL, T3FREE, THYROIDAB in the last 72 hours.  Invalid input(s): FREET3   Coagulation profile No results for input(s): INR, PROTIME in the last 168 hours. ------------------------------------------------------------------------------------------------------------------- No results for input(s): DDIMER in the last 72 hours. -------------------------------------------------------------------------------------------------------------------  Cardiac Enzymes  Recent Labs Lab 06/25/16 1840  TROPONINI 0.08*   ------------------------------------------------------------------------------------------------------------------ Invalid input(s): POCBNP  ---------------------------------------------------------------------------------------------------------------  Urinalysis    Component Value Date/Time   COLORURINE YELLOW* 06/25/2016 2345   COLORURINE Yellow 10/11/2013 2124   APPEARANCEUR CLEAR* 06/25/2016 2345   APPEARANCEUR Clear 10/11/2013 2124   LABSPEC 1.018 06/25/2016 2345   LABSPEC 1.010 10/11/2013 2124   PHURINE 5.0 06/25/2016 2345   PHURINE 6.0 10/11/2013 2124    GLUCOSEU NEGATIVE 06/25/2016 2345   GLUCOSEU Negative 10/11/2013 2124   HGBUR NEGATIVE 06/25/2016 2345   HGBUR Negative 10/11/2013 2124   BILIRUBINUR NEGATIVE 06/25/2016 2345   BILIRUBINUR Negative 10/11/2013 2124   KETONESUR 1+* 06/25/2016 2345   KETONESUR Negative 10/11/2013 2124   PROTEINUR NEGATIVE 06/25/2016 2345   PROTEINUR Negative 10/11/2013 2124   NITRITE NEGATIVE 06/25/2016 2345   NITRITE Negative 10/11/2013 2124   LEUKOCYTESUR NEGATIVE 06/25/2016 2345   LEUKOCYTESUR Negative 10/11/2013 2124     RADIOLOGY: Ct Head Wo Contrast  06/25/2016  CLINICAL DATA:  Patient found down. EXAM: CT HEAD WITHOUT CONTRAST CT CERVICAL SPINE WITHOUT CONTRAST TECHNIQUE: Multidetector CT imaging of the head and cervical spine was performed following the standard protocol without intravenous contrast. Multiplanar CT image reconstructions of the cervical spine were also generated. COMPARISON:  None. FINDINGS: CT HEAD FINDINGS Paranasal sinuses, mastoid air cells, and bones are normal. Extracranial soft tissues are unremarkable. No subdural, epidural, or subarachnoid hemorrhage identified. Ventricles and sulci are prominent, likely due to age related atrophy. Scattered lacunar infarcts are seen, particularly in the basal ganglia, left greater than right. No acute cortical ischemia or infarct. Cerebellum is unremarkable. Basal cisterns are patent. A small lacunar infarct is suspected in the left side of the pons, likely old. The right transverse dural venous sinus is more prominent than the left. No acute cortical ischemia or infarct. CT CERVICAL SPINE FINDINGS Evaluation of the cervical spine is limited due to patient body habitus and surgical hardware. There is reversal of normal lordosis at C5-6. No other malalignment is identified. Within limitations, no fractures are seen. Pedicle screws are seen from  C3 through C7. A rod then extends inferiorly spanning the level of T2 which demonstrates a severe  compression fracture which is age indeterminate but favore to be chronic. No other acute abnormalities. IMPRESSION: 1. The right transverse dural venous sinus is much more prominent than the left. While this may be normal for this patient, there are no comparisons and it would be difficult to exclude dural venous thrombosis on this noncontrast head CT. If there is concern, an MRV could better evaluate. 2. No other acute intracranial abnormalities. 3. Evaluation of the cervical spine is limited due to surgical hardware and body habitus. No fractures or traumatic malalignment is identified. The reversal of normal lordosis at C5-6 is favored to be chronic. There is an age-indeterminate fracture through T2 with severe loss of height, also favored to be chronic. Findings are being called to the referring clinical team. I spoke to Dr. Junita Push. Electronically Signed   By: Dorise Bullion III M.D   On: 06/25/2016 20:01   Ct Cervical Spine Wo Contrast  06/25/2016  CLINICAL DATA:  Patient found down. EXAM: CT HEAD WITHOUT CONTRAST CT CERVICAL SPINE WITHOUT CONTRAST TECHNIQUE: Multidetector CT imaging of the head and cervical spine was performed following the standard protocol without intravenous contrast. Multiplanar CT image reconstructions of the cervical spine were also generated. COMPARISON:  None. FINDINGS: CT HEAD FINDINGS Paranasal sinuses, mastoid air cells, and bones are normal. Extracranial soft tissues are unremarkable. No subdural, epidural, or subarachnoid hemorrhage identified. Ventricles and sulci are prominent, likely due to age related atrophy. Scattered lacunar infarcts are seen, particularly in the basal ganglia, left greater than right. No acute cortical ischemia or infarct. Cerebellum is unremarkable. Basal cisterns are patent. A small lacunar infarct is suspected in the left side of the pons, likely old. The right transverse dural venous sinus is more prominent than the left. No acute cortical  ischemia or infarct. CT CERVICAL SPINE FINDINGS Evaluation of the cervical spine is limited due to patient body habitus and surgical hardware. There is reversal of normal lordosis at C5-6. No other malalignment is identified. Within limitations, no fractures are seen. Pedicle screws are seen from C3 through C7. A rod then extends inferiorly spanning the level of T2 which demonstrates a severe compression fracture which is age indeterminate but favore to be chronic. No other acute abnormalities. IMPRESSION: 1. The right transverse dural venous sinus is much more prominent than the left. While this may be normal for this patient, there are no comparisons and it would be difficult to exclude dural venous thrombosis on this noncontrast head CT. If there is concern, an MRV could better evaluate. 2. No other acute intracranial abnormalities. 3. Evaluation of the cervical spine is limited due to surgical hardware and body habitus. No fractures or traumatic malalignment is identified. The reversal of normal lordosis at C5-6 is favored to be chronic. There is an age-indeterminate fracture through T2 with severe loss of height, also favored to be chronic. Findings are being called to the referring clinical team. I spoke to Dr. Junita Push. Electronically Signed   By: Dorise Bullion III M.D   On: 06/25/2016 20:01    EKG: Orders placed or performed during the hospital encounter of 06/25/16  . EKG 12-Lead  . EKG 12-Lead  . ED EKG  . ED EKG    IMPRESSION AND PLAN: 70 year old male patient with history of lymphoma presented to the emergency room with confusion and slurred speech. Admitting diagnosis 1. Altered mental status 2. Dysarthria rule  out CVA 3. Leukocytosis 4. Lymphoma Treatment plan Admit patient to telemetry inpatient service IV fluid hydration MRI brain throat CVA Carotid ultrasound to rule out obstruction Follow-up WBC count Aspirin rectally.  Neurology consultation for evaluation of CVA and  altered mental status DVT prophylaxis with subcutaneous Lovenox 40 MG daily Physical therapy evaluation Supportive care.   All the records are reviewed and case discussed with ED provider. Management plans discussed with the patient, family and they are in agreement.  CODE STATUS:FULL Code Status History    This patient does not have a recorded code status. Please follow your organizational policy for patients in this situation.       TOTAL TIME TAKING CARE OF THIS PATIENT: 56 minutes.    Saundra Shelling M.D on 06/26/2016 at 2:09 AM  Between 7am to 6pm - Pager - [phone]  After 6pm go to [email or web address] - password [email or web address] [email or web address]  Office  [phone]  CC: Primary care physician; Halina Maidens, MD

## 2016-06-26 NOTE — Progress Notes (Signed)
Patient is back from testing. MD at bedside and asked about patient's BP meds. Since BP is stable and patient could of possibly had a stroke, MD stated okay to hold two BP meds today. Will give aspirin only for now.

## 2016-06-27 ENCOUNTER — Inpatient Hospital Stay: Payer: Medicare Other

## 2016-06-27 LAB — ECHOCARDIOGRAM COMPLETE
AOASC: 31 cm
E decel time: 254 msec
EERAT: 7.61
FS: 19 % — AB (ref 28–44)
HEIGHTINCHES: 62 in
IV/PV OW: 1.34
LA diam end sys: 31 mm
LADIAMINDEX: 1.78 cm/m2
LASIZE: 31 mm
LDCA: 3.8 cm2
LV E/e' medial: 7.61
LV E/e'average: 7.61
LVELAT: 7.4 cm/s
LVOT VTI: 20 cm
LVOT diameter: 22 mm
LVOTPV: 84.3 cm/s
LVOTSV: 76 mL
MV Dec: 254
MV pk A vel: 99 m/s
MVPKEVEL: 56.3 m/s
PW: 11.2 mm — AB (ref 0.6–1.1)
TDI e' lateral: 7.4
TDI e' medial: 5.11
WEIGHTICAEL: 2566.4 [oz_av]

## 2016-06-27 LAB — LIPID PANEL
CHOLESTEROL: 139 mg/dL (ref 0–200)
HDL: 25 mg/dL — ABNORMAL LOW (ref >40)
LDL Cholesterol: 97 mg/dL (ref 0–99)
TRIGLYCERIDES: 86 mg/dL (ref <150)
Total CHOL/HDL Ratio: 5.6 RATIO
VLDL: 17 mg/dL (ref 0–40)

## 2016-06-27 LAB — HEMOGLOBIN A1C: HEMOGLOBIN A1C: 5.7 % (ref 4.0–6.0)

## 2016-06-27 NOTE — Progress Notes (Signed)
Report called to 1C RN, Daleen Snook. Patient's daughter was at bedside and updated. Belongings sent with patient as well as new diet instructions to place over head of bed. Patient's son Lennette Bihari called and updated with plan of care.

## 2016-06-27 NOTE — Evaluation (Signed)
Objective Swallowing Evaluation: Type of Study: MBS-Modified Barium Swallow Study  Patient Details  Name: Kyle Choi MRN: [id number] Date of Birth: [birth year]  Today's Date: 06/27/2016 Time: SLP Start Time (ACUTE ONLY): 1115-SLP Stop Time (ACUTE ONLY): 1200 SLP Time Calculation (min) (ACUTE ONLY): 45 min  Past Medical History:  Past Medical History  Diagnosis Date  . Cancer ([place])   . Lymphoma ([place])   . Heart disease    Past Surgical History:  Past Surgical History  Procedure Laterality Date  . Cervical disc surgery      c1-7 [facility]  . Coronary angioplasty with stent placement     HPI: Pt is a 70 y.o. male with a known history of Lymphoma, heart disease presented to the emergency room with confusion for the last 3 days. Patient was accompanied by family members, according to them patient has been confused and having slurred speech for the last 3 days. He also had impaired gait and difficulty walking. Patient usually is able to walk by himself and he has a walker at home. Currently, pt is resting in bed. He is alert/awake and attempting to communicate w/ SLP but speech was often mumbled, quick  and dysarthric(?). He was frequently labile (moderate-severe in degree). He denied being "sad" to this [employer] student. Pt followed instructions given min. cues. Noted mild pharyngeal wetness at rest prior to any po's given; pt only throat cleared intermittently to the wetness.    Subjective: Alert, labile  Assessment / Plan / Recommendation  CHL IP CLINICAL IMPRESSIONS 06/27/2016  Therapy Diagnosis Mild oral phase dysphagia;Moderate pharyngeal phase dysphagia  Clinical Impression This 70 year old man; with acute CVA and clinical indicators of aspiration at bedside swallowing evaluation; is presenting with moderate oropharyngeal dysphagia characterized by slow and disorganized oral management, delayed pharyngeal swallow initiation, decreased tongue base retraction (with min vallecular residue), and  decreased laryngeal vestibule closure at the height of the swallow.  There is no observed laryngeal penetration or aspiration of solid, honey-thick liquid, or cup rim presentation of nectar-thick liquid.  With teaspoon presentation of thin liquid, there is trace laryngeal penetration 1 of 3 trials.  With self-administered thin liquid there is aspiration from the pyriform sinus as the swallow is begun.  The patient demonstrates an immediate and vigorous cough.  An MBS involves limited PO trials, but the patient appears to be at risk for fatiguing over time with reduced coordination of his swallow.  This study supports a Dysphagia III diet with nectar-thick liquids.  Recommend full supervision with meals and terminating meal if patient begins coughing or exhibiting difficulty initiating swallows.  Impact on safety and function --      CHL IP TREATMENT RECOMMENDATION 06/27/2016  Treatment Recommendations Defer treatment plan to f/u with SLP     Prognosis 06/27/2016  Prognosis for Safe Diet Advancement Good  Barriers to Reach Goals Severity of deficits  Barriers/Prognosis Comment --    CHL IP DIET RECOMMENDATION 06/27/2016  SLP Diet Recommendations Dysphagia 3 (Mech soft) solids;Nectar thick liquid  Liquid Administration via Cup;No straw  Medication Administration Whole meds with puree  Compensations --  Postural Changes --      CHL IP OTHER RECOMMENDATIONS 06/26/2016  Recommended Consults --  Oral Care Recommendations --  Other Recommendations (No Data)      CHL IP FOLLOW UP RECOMMENDATIONS 06/26/2016  Follow up Recommendations (No Data)      CHL IP FREQUENCY AND DURATION 06/27/2016  Speech Therapy Frequency (ACUTE ONLY) min 3x week  Treatment Duration 2 weeks           CHL IP ORAL PHASE 06/27/2016  Oral Phase Impaired  Oral - Pudding Teaspoon --  Oral - Pudding Cup --  Oral - Honey Teaspoon Decreased bolus cohesion;Delayed oral transit;Left anterior bolus loss  Oral - Honey Cup --   Oral - Nectar Teaspoon Decreased bolus cohesion;Delayed oral transit;WFL;Left anterior bolus loss  Oral - Nectar Cup Left anterior bolus loss;Delayed oral transit;Decreased bolus cohesion  Oral - Nectar Straw --  Oral - Thin Teaspoon Delayed oral transit;Decreased bolus cohesion  Oral - Thin Cup Delayed oral transit;Decreased bolus cohesion;Left anterior bolus loss  Oral - Thin Straw --  Oral - Puree Delayed oral transit;Decreased bolus cohesion  Oral - Mech Soft Delayed oral transit;Decreased bolus cohesion  Oral - Regular --  Oral - Multi-Consistency --  Oral - Pill --  Oral Phase - Comment Mild disorganization    CHL IP PHARYNGEAL PHASE 06/27/2016  Pharyngeal Phase Impaired  Pharyngeal- Pudding Teaspoon --  Pharyngeal --  Pharyngeal- Pudding Cup --  Pharyngeal --  Pharyngeal- Honey Teaspoon Delayed swallow initiation-vallecula;Pharyngeal residue - valleculae;Reduced tongue base retraction  Pharyngeal --  Pharyngeal- Honey Cup --  Pharyngeal --  Pharyngeal- Nectar Teaspoon Delayed swallow initiation-pyriform sinuses;Reduced tongue base retraction;Pharyngeal residue - valleculae  Pharyngeal --  Pharyngeal- Nectar Cup Delayed swallow initiation-pyriform sinuses;Reduced tongue base retraction;Pharyngeal residue - valleculae  Pharyngeal --  Pharyngeal- Nectar Straw --  Pharyngeal --  Pharyngeal- Thin Teaspoon Delayed swallow initiation-pyriform sinuses;Reduced tongue base retraction;Penetration/Aspiration during swallow;Pharyngeal residue - valleculae  Pharyngeal Material enters airway, remains ABOVE vocal cords then ejected out  Pharyngeal- Thin Cup Delayed swallow initiation-pyriform sinuses;Penetration/Aspiration during swallow;Moderate aspiration;Pharyngeal residue - valleculae  Pharyngeal Material enters airway, passes BELOW cords then ejected out  Pharyngeal- Thin Straw --  Pharyngeal --  Pharyngeal- Puree Delayed swallow initiation-vallecula;Reduced tongue base  retraction;Pharyngeal residue - valleculae  Pharyngeal --  Pharyngeal- Mechanical Soft Pharyngeal residue - valleculae;Reduced tongue base retraction  Pharyngeal --  Pharyngeal- Regular --  Pharyngeal --  Pharyngeal- Multi-consistency --  Pharyngeal --  Pharyngeal- Pill --  Pharyngeal --  Pharyngeal Comment Swallow oordination appears to decrease over time     CHL IP CERVICAL ESOPHAGEAL PHASE 06/27/2016  Cervical Esophageal Phase (No Data)  Pudding Teaspoon --  Pudding Cup --  Honey Teaspoon --  Honey Cup --  Nectar Teaspoon --  Nectar Cup --  Nectar Straw --  Thin Teaspoon --  Thin Cup --  Thin Straw --  Puree --  Mechanical Soft --  Regular --  Multi-consistency --  Pill --  Cervical Esophageal Comment --    No flowsheet data found.  Leroy Sea, MS/CCC- SLP  Lou Miner 06/27/2016, 1:46 PM

## 2016-06-27 NOTE — Progress Notes (Signed)
Initial Nutrition Assessment      INTERVENTION:  -Monitor intake and cater to pt preferences within diet order -Will add mightyshake II BID for added nutrition (nectar thick consistency)   NUTRITION DIAGNOSIS:   Inadequate oral intake related to acute illness as evidenced by per patient/family report, NPO status.    GOAL:   Patient will meet greater than or equal to 90% of their needs    MONITOR:   Diet advancement  REASON FOR ASSESSMENT:   Malnutrition Screening Tool    ASSESSMENT:      Pt admitted with slurred speech, AMS, CVA.  SLP performing MBSS today  Past Medical History  Diagnosis Date  . Cancer ([place])   . Lymphoma ([place])   . Heart disease    Pt tearful during visit, slurred speech. Reports decrease in appetite prior to admission for about 1 week.  Medications reviewed Labs reviewed  Nutrition-Focused physical exam completed. Findings are WDL for fat depletion, muscle depletion, and edema.    Diet Order:  DIET DYS 2 Room service appropriate?: Yes with Assist; Fluid consistency:: Nectar Thick  Skin:  Reviewed, no issues  Last BM:  6/27  Height:   Ht Readings from Last 1 Encounters:  06/25/16 5\' 2"  (1.575 m)    Weight: Pt reports UBW of 160-170 pounds  Wt Readings from Last 1 Encounters:  06/26/16 160 lb 6.4 oz (72.757 kg)    Ideal Body Weight:     BMI:  Body mass index is 29.33 kg/(m^2).  Estimated Nutritional Needs:   Kcal:  [id number] kcals/d  Protein:  86-112 g/d  Fluid:  1.7-2.2 L/d  EDUCATION NEEDS:   No education needs identified at this time  Madlyn Crosby B. Zenia Resides, [place], [place] (pager) Weekend/On-Call pager [phone])

## 2016-06-27 NOTE — Care Management (Signed)
Both PT and OT have recommended acute inpatient rehab.  Updated Kyle Choi and she will review this evening

## 2016-06-27 NOTE — Evaluation (Signed)
Physical Therapy Evaluation Patient Details Name: Kyle Choi MRN: [id number] DOB: 1946/12/15 Today's Date: 06/27/2016   History of Present Illness  70 yo M presented to ED for weakness, confusion, lethargy, decreased appetite, slurred speech and was found on the floor by his daughter the day of arrival to ED. 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 PMH includes cancer, heart disease, coronary angioplasty.  Clinical Impression  Pt demonstrated generalized weakness and difficulty walking after recent CVA. R UE and LE strength is WFL, but mild decreased coordination. L UE is grossly 2/5 and unable to perform finger to nose test. L LE strength grossly 3/5 and was able to perform heel to shin with decreased coordination. Pt requires mod A for bed mobility. Seated balance is poor requiring min A to min guard to maintain. Standing balance is poor requiring mod A to maintain with B UE support. Transfers and limited ambulation of 2 ft with FWW required mod A +2. Pt demonstrated difficulty lifting L LE and advancing leg. CIR is recommended after hospital discharge to address deficits of strength, balance, coordination, endurance, transfers and gait to progress towards PLOF. He presents as a good candidate for this treatment. Pt will benefit from skilled PT services to increase functional I and mobility for safe discharge.     Follow Up Recommendations CIR    Equipment Recommendations  None recommended by PT (pt has recommended FWW)    Recommendations for Other Services       Precautions / Restrictions Precautions Precautions: Fall Restrictions Weight Bearing Restrictions: No      Mobility  Bed Mobility Overal bed mobility: Needs Assistance Bed Mobility: Supine to Sit     Supine to sit: Mod assist;HOB elevated     General bed mobility comments: assistance to place hand on rail, cues for rolling and bringing legs of EOB, difficulty getting  trunk upright  Transfers Overall transfer level: Needs assistance Equipment used: Rolling walker (2 wheeled) Transfers: Sit to/from [facility] Sit to Stand: Mod assist;+2 physical assistance;From elevated surface Stand pivot transfers: Mod assist;+2 physical assistance;From elevated surface       General transfer comment: posterior lean, difficulty lifting and advancing L LE, decreased coordination and ability to process technique  Ambulation/Gait Ambulation/Gait assistance: Mod assist;+2 physical assistance Ambulation Distance (Feet): 2 Feet Assistive device: Rolling walker (2 wheeled) Gait Pattern/deviations: Ataxic;Decreased dorsiflexion - left;Step-to pattern;Leaning posteriorly;Narrow base of support Gait velocity: reduced Gait velocity interpretation: Below normal speed for age/gender General Gait Details: Cues for anterior weight shifting and technique of advancing LEs, limited by L sided weakness and decreased coordination  Stairs            Wheelchair Mobility    Modified Rankin (Stroke Patients Only)       Balance Overall balance assessment: Needs assistance;History of Falls Sitting-balance support: Bilateral upper extremity supported;Feet supported Sitting balance-[name] Scale: Poor Sitting balance - Comments: initially min A to maintain, progressed to min guard Postural control: Posterior lean Standing balance support: Bilateral upper extremity supported Standing balance-[name] Scale: Poor Standing balance comment: required mod A to maintain, posterior lean                             Pertinent Vitals/Pain Pain Assessment: No/denies pain    Home Living Family/patient expects to be discharged to:: Private residence Living Arrangements: Children Available Help at Discharge: Family Type of Home: [facility]  Access: Stairs to enter Entrance Stairs-Rails: None Entrance Stairs-Number of Steps: 2 Home Layout: One level Home  Equipment: Walker - 2 wheels Additional Comments: Pt lives with his daughter.    Prior Function Level of Independence: Independent with assistive device(s)         Comments: Pt has most recently been using a FWW for ambulation. He stated it was the past few days and prior to that he wasn't using an AD. Unsure of accuracy of the timeline. Pt independent with ADLs, driving.      Hand Dominance        Extremity/Trunk Assessment   Upper Extremity Assessment: Defer to OT evaluation;LUE deficits/detail (L UE grossly 2/5, unable to lift in full shoulder flexion)           Lower Extremity Assessment: Generalized weakness;LLE deficits/detail   LLE Deficits / Details: strength grossly 3/5  Cervical / Trunk Assessment: Kyphotic  Communication   Communication: Expressive difficulties  Cognition Arousal/Alertness: Awake/alert Behavior During Therapy: Flat affect Overall Cognitive Status: No family/caregiver present to determine baseline cognitive functioning                      General Comments General comments (skin integrity, edema, etc.): tearful occasionally    Exercises Other Exercises Other Exercises: B LE seated therex: ankle pumps, LAQs, marching x10 with verbal, visual and tactile cues for staying on task and proper technique. Pt sat at EOB x5 minutes for trunk strengthening with min A progressing to min guard. Cues for postural correction and weight shifting.      Assessment/Plan    PT Assessment Patient needs continued PT services  PT Diagnosis Difficulty walking;Generalized weakness   PT Problem List Decreased strength;Decreased range of motion;Decreased activity tolerance;Decreased balance;Decreased mobility;Decreased coordination;Decreased knowledge of use of DME  PT Treatment Interventions DME instruction;Gait training;Stair training;Therapeutic activities;Therapeutic exercise;Balance training;Neuromuscular re-education;Patient/family education   PT  Goals (Current goals can be found in the Care Plan section) Acute Rehab PT Goals Patient Stated Goal: to get stronger PT Goal Formulation: With patient Time For Goal Achievement: 07/11/16 Potential to Achieve Goals: Fair    Frequency 7X/week   Barriers to discharge Inaccessible home environment;Decreased caregiver support steps to enter, requires +2 assistance currently    Co-evaluation               End of Session Equipment Utilized During Treatment: Gait belt Activity Tolerance: Patient tolerated treatment well;Patient limited by fatigue Patient left: in chair;with call bell/phone within reach;with chair alarm set Nurse Communication: Mobility status         Time: [id number] PT Time Calculation (min) (ACUTE ONLY): 23 min   Charges:   PT Evaluation $PT Eval High Complexity: 1 Procedure PT Treatments $Therapeutic Exercise: 8-22 mins   PT G Codes:        Neoma Laming, PT, DPT  06/27/2016, 11:33 AM [phone]

## 2016-06-27 NOTE — Progress Notes (Signed)
[facility] [place] NAME: Kyle Choi    MR#:  [id number]  DATE OF BIRTH:  1946-03-14  SUBJECTIVE:  CHIEF COMPLAINT:   Chief Complaint  Patient presents with  . Altered Mental Status   Came with speech problem and left sided weakness for last 2-3 days. 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   No new complains today started PT.  REVIEW OF SYSTEMS:  CONSTITUTIONAL: No fever, fatigue or weakness.  EYES: No blurred or double vision.  EARS, NOSE, AND THROAT: No tinnitus or ear pain.  RESPIRATORY: No cough, shortness of breath, wheezing or hemoptysis.  CARDIOVASCULAR: No chest pain, orthopnea, edema.  GASTROINTESTINAL: No nausea, vomiting, diarrhea or abdominal pain.  GENITOURINARY: No dysuria, hematuria.  ENDOCRINE: No polyuria, nocturia,  HEMATOLOGY: No anemia, easy bruising or bleeding SKIN: No rash or lesion. MUSCULOSKELETAL: No joint pain or arthritis.   NEUROLOGIC: No tingling, numbness, left sided weakness.  PSYCHIATRY: No anxiety or depression.   ROS  DRUG ALLERGIES:   Allergies  Allergen Reactions  . Ace Inhibitors Cough    VITALS:  Blood pressure 146/61, pulse 72, temperature 98 F (36.7 C), temperature source Oral, resp. rate 18, height 5\' 2"  (1.575 m), weight 72.757 kg (160 lb 6.4 oz), SpO2 95 %.  PHYSICAL EXAMINATION:  GENERAL:  71 y.o.-year-old patient lying in the bed with no acute distress.  EYES: Pupils equal, round, reactive to light and accommodation. No scleral icterus. Extraocular muscles intact.  HEENT: Head atraumatic, normocephalic. Oropharynx and nasopharynx clear.  NECK:  Supple, no jugular venous distention. No thyroid enlargement, no tenderness.  LUNGS: Normal breath sounds bilaterally, no wheezing, rales,rhonchi or crepitation. No use of accessory muscles of respiration.  CARDIOVASCULAR: S1, S2 normal. No murmurs, rubs, or gallops.  ABDOMEN: Soft, nontender, nondistended. Bowel sounds  present. No organomegaly or mass.  EXTREMITIES: No pedal edema, cyanosis, or clubbing.  NEUROLOGIC: Cranial nerves II through XII are intact. Muscle strength 5/5 in right sided extremities, left side 3-4/5 power. Sensation intact. Gait not checked.  PSYCHIATRIC: The patient is alert and oriented x 3.  SKIN: No obvious rash, lesion, or ulcer.   Physical Exam LABORATORY PANEL:   CBC  Recent Labs Lab 06/26/16 0534  WBC 14.2*  HGB 14.2  HCT 42.4  PLT 263   ------------------------------------------------------------------------------------------------------------------  Chemistries   Recent Labs Lab 06/25/16 1840 06/26/16 0534  NA 135  --   K 4.0  --   CL 99*  --   CO2 25  --   GLUCOSE 108*  --   BUN 16  --   CREATININE 1.28* 1.29*  CALCIUM 9.4  --   AST 22  --   ALT 14*  --   ALKPHOS 114  --   BILITOT 1.6*  --    ------------------------------------------------------------------------------------------------------------------  Cardiac Enzymes  Recent Labs Lab 06/25/16 1840  TROPONINI 0.08*   ------------------------------------------------------------------------------------------------------------------  RADIOLOGY:  Ct Angio Head W Or Wo Contrast  06/26/2016  CLINICAL DATA:  Confusion over the last 3 days. Slurred speech. Impaired gait and difficulty walking. Acute nonhemorrhagic infarcts involving the brainstem and right occipital lobe. EXAM: CT ANGIOGRAPHY HEAD AND NECK TECHNIQUE: Multidetector CT imaging of the head and neck was performed using the standard protocol during bolus administration of intravenous contrast. Multiplanar CT image reconstructions and MIPs were obtained to evaluate the vascular anatomy. Carotid stenosis measurements (when applicable) are obtained utilizing NASCET criteria, using the distal internal carotid diameter as the denominator. CONTRAST:  75 mL Isovue 370 COMPARISON:  MRI brain and MRA head from the same day. FINDINGS: CTA NECK  Aortic arch: Moderate atherosclerotic changes are present at the origins of the great vessels without significant stenosis relative to the more distal vessels. Right carotid system: Atherosclerotic changes are noted at the bifurcation of the innominate artery. There is wall thickening in the distal right common carotid artery. Atherosclerotic changes are present at the right carotid bifurcation. There is some irregularity of the proximal right ICA. Mild tortuosity is present in the more distal right ICA without significant stenosis. Left carotid system: The left common carotid artery demonstrates multifocal atherosclerotic change without a significant stenosis. Extensive calcification and noncalcified plaque is present at the carotid bifurcation without significant stenosis. There is mild tortuosity of the distal cervical left ICA. Calcifications are present in the distal cervical left ICA just proximal to the skullbase. This may represent a previous traumatic injury without a significant residual stenosis. Vertebral arteries:The vertebral arteries both originate from the subclavian arteries. Left vertebral artery is dominant. There is some artifact from cervical spinal fusion. No significant vertebral artery stenosis is evident in the neck. Skeleton: Extensive posterior cervical thoracic fusion is present. This extends superiorly to C3. There is significant lucency surrounding the posterior element screws at C3 suggesting movement. There is no lucency at the level of C4 below. Grade 1 retrolisthesis is present at C5-6. The severe remote compression fracture is present at T2. The construct ends on the right at T3 that extends to a least T4 and T5 on the left. Other neck: No focal mucosal or submucosal lesions are present. The parotid glands are somewhat atrophic. The submandibular glands are within normal limits bilaterally. The thyroid is markedly atrophic. No significant cervical adenopathy is present. CTA HEAD  Anterior circulation: Dense calcifications are present within the cavernous internal carotid arteries bilaterally. There is mild narrowing bilaterally. This appears less severe than on the MRA. MRA was artifactually exaggerated by calcifications and patient motion. The A1 and M1 segments are within normal limits. The anterior communicating artery is patent. MCA bifurcations are intact. There is mild narrowing of proximal M2 segment on the right. There is some attenuation of distal MCA and ACA branch vessels bilaterally without a significant proximal stenosis or occlusion. No aneurysm is present. Posterior circulation: Moderate stenosis present within the non dominant right vertebral artery at the dural margin. The right vertebral artery terminates at the PICA. Is calcification and severe stenosis of the left vertebral artery at the dural margin. The left vertebral artery is occluded proximal to the vertebrobasilar junction. The proximal basilar artery is occluded. A small distal basilar artery is reconstituted. This may represent retrograde flow via posterior communicating arteries. The posterior cerebral arteries are both moderately attenuated. Venous sinuses: The dural sinuses are patent. Anatomic variants: Posterior communicating arteries bilaterally feed the basilar tip Delayed phase: Postcontrast images demonstrate a remote encephalomalacia of the medial left occipital lobe. Remote lacunar infarcts are again noted within the basal ganglia bilaterally. The brainstem infarcts are noted. IMPRESSION: 1. 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 The distal basilar artery is moderately attenuated. 2. 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 3. Mild narrowing of the cavernous internal carotid arteries bilaterally. 4. Atherosclerotic changes at the aortic arch and bilateral  carotid bifurcations without significant stenosis. 5. Posterior cervical thoracic fusion with the remote compression fracture at T2. 6.  Lucency surrounding the lateral mass screws at C3 suggests motion bilaterally. These results were called by telephone at the time of interpretation on 06/26/2016 at 7:08 pm to Dr. Wallie Char, who verbally acknowledged these results. Electronically Signed   By: San Morelle M.D.   On: 06/26/2016 19:09   Ct Head Wo Contrast  06/25/2016  CLINICAL DATA:  Patient found down. EXAM: CT HEAD WITHOUT CONTRAST CT CERVICAL SPINE WITHOUT CONTRAST TECHNIQUE: Multidetector CT imaging of the head and cervical spine was performed following the standard protocol without intravenous contrast. Multiplanar CT image reconstructions of the cervical spine were also generated. COMPARISON:  None. FINDINGS: CT HEAD FINDINGS Paranasal sinuses, mastoid air cells, and bones are normal. Extracranial soft tissues are unremarkable. No subdural, epidural, or subarachnoid hemorrhage identified. Ventricles and sulci are prominent, likely due to age related atrophy. Scattered lacunar infarcts are seen, particularly in the basal ganglia, left greater than right. No acute cortical ischemia or infarct. Cerebellum is unremarkable. Basal cisterns are patent. A small lacunar infarct is suspected in the left side of the pons, likely old. The right transverse dural venous sinus is more prominent than the left. No acute cortical ischemia or infarct. CT CERVICAL SPINE FINDINGS Evaluation of the cervical spine is limited due to patient body habitus and surgical hardware. There is reversal of normal lordosis at C5-6. No other malalignment is identified. Within limitations, no fractures are seen. Pedicle screws are seen from C3 through C7. A rod then extends inferiorly spanning the level of T2 which demonstrates a severe compression fracture which is age indeterminate but favore to be chronic. No other acute  abnormalities. IMPRESSION: 1. The right transverse dural venous sinus is much more prominent than the left. While this may be normal for this patient, there are no comparisons and it would be difficult to exclude dural venous thrombosis on this noncontrast head CT. If there is concern, an MRV could better evaluate. 2. No other acute intracranial abnormalities. 3. Evaluation of the cervical spine is limited due to surgical hardware and body habitus. No fractures or traumatic malalignment is identified. The reversal of normal lordosis at C5-6 is favored to be chronic. There is an age-indeterminate fracture through T2 with severe loss of height, also favored to be chronic. Findings are being called to the referring clinical team. I spoke to Dr. Junita Push. Electronically Signed   By: Dorise Bullion III M.D   On: 06/25/2016 20:01   Ct Angio Neck W Or Wo Contrast  06/26/2016  CLINICAL DATA:  Confusion over the last 3 days. Slurred speech. Impaired gait and difficulty walking. Acute nonhemorrhagic infarcts involving the brainstem and right occipital lobe. EXAM: CT ANGIOGRAPHY HEAD AND NECK TECHNIQUE: Multidetector CT imaging of the head and neck was performed using the standard protocol during bolus administration of intravenous contrast. Multiplanar CT image reconstructions and MIPs were obtained to evaluate the vascular anatomy. Carotid stenosis measurements (when applicable) are obtained utilizing NASCET criteria, using the distal internal carotid diameter as the denominator. CONTRAST:  75 mL Isovue 370 COMPARISON:  MRI brain and MRA head from the same day. FINDINGS: CTA NECK Aortic arch: Moderate atherosclerotic changes are present at the origins of the great vessels without significant stenosis relative to the more distal vessels. Right carotid system: Atherosclerotic changes are noted at the bifurcation of the innominate artery. There is wall thickening in the distal right common carotid artery. Atherosclerotic  changes are present at the right carotid bifurcation. There is some irregularity of the proximal right  ICA. Mild tortuosity is present in the more distal right ICA without significant stenosis. Left carotid system: The left common carotid artery demonstrates multifocal atherosclerotic change without a significant stenosis. Extensive calcification and noncalcified plaque is present at the carotid bifurcation without significant stenosis. There is mild tortuosity of the distal cervical left ICA. Calcifications are present in the distal cervical left ICA just proximal to the skullbase. This may represent a previous traumatic injury without a significant residual stenosis. Vertebral arteries:The vertebral arteries both originate from the subclavian arteries. Left vertebral artery is dominant. There is some artifact from cervical spinal fusion. No significant vertebral artery stenosis is evident in the neck. Skeleton: Extensive posterior cervical thoracic fusion is present. This extends superiorly to C3. There is significant lucency surrounding the posterior element screws at C3 suggesting movement. There is no lucency at the level of C4 below. Grade 1 retrolisthesis is present at C5-6. The severe remote compression fracture is present at T2. The construct ends on the right at T3 that extends to a least T4 and T5 on the left. Other neck: No focal mucosal or submucosal lesions are present. The parotid glands are somewhat atrophic. The submandibular glands are within normal limits bilaterally. The thyroid is markedly atrophic. No significant cervical adenopathy is present. CTA HEAD Anterior circulation: Dense calcifications are present within the cavernous internal carotid arteries bilaterally. There is mild narrowing bilaterally. This appears less severe than on the MRA. MRA was artifactually exaggerated by calcifications and patient motion. The A1 and M1 segments are within normal limits. The anterior communicating  artery is patent. MCA bifurcations are intact. There is mild narrowing of proximal M2 segment on the right. There is some attenuation of distal MCA and ACA branch vessels bilaterally without a significant proximal stenosis or occlusion. No aneurysm is present. Posterior circulation: Moderate stenosis present within the non dominant right vertebral artery at the dural margin. The right vertebral artery terminates at the PICA. Is calcification and severe stenosis of the left vertebral artery at the dural margin. The left vertebral artery is occluded proximal to the vertebrobasilar junction. The proximal basilar artery is occluded. A small distal basilar artery is reconstituted. This may represent retrograde flow via posterior communicating arteries. The posterior cerebral arteries are both moderately attenuated. Venous sinuses: The dural sinuses are patent. Anatomic variants: Posterior communicating arteries bilaterally feed the basilar tip Delayed phase: Postcontrast images demonstrate a remote encephalomalacia of the medial left occipital lobe. Remote lacunar infarcts are again noted within the basal ganglia bilaterally. The brainstem infarcts are noted. IMPRESSION: 1. 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 The distal basilar artery is moderately attenuated. 2. 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 3. Mild narrowing of the cavernous internal carotid arteries bilaterally. 4. Atherosclerotic changes at the aortic arch and bilateral carotid bifurcations without significant stenosis. 5. Posterior cervical thoracic fusion with the remote compression fracture at T2. 6. Lucency surrounding the lateral mass screws at C3 suggests motion bilaterally. These results were called by telephone at the time of interpretation on 06/26/2016 at 7:08 pm to Dr. Wallie Char, who  verbally acknowledged these results. Electronically Signed   By: San Morelle M.D.   On: 06/26/2016 19:09   Ct Cervical Spine Wo Contrast  06/25/2016  CLINICAL DATA:  Patient found down. EXAM: CT HEAD WITHOUT CONTRAST CT CERVICAL SPINE WITHOUT CONTRAST TECHNIQUE: Multidetector CT imaging of the head and  cervical spine was performed following the standard protocol without intravenous contrast. Multiplanar CT image reconstructions of the cervical spine were also generated. COMPARISON:  None. FINDINGS: CT HEAD FINDINGS Paranasal sinuses, mastoid air cells, and bones are normal. Extracranial soft tissues are unremarkable. No subdural, epidural, or subarachnoid hemorrhage identified. Ventricles and sulci are prominent, likely due to age related atrophy. Scattered lacunar infarcts are seen, particularly in the basal ganglia, left greater than right. No acute cortical ischemia or infarct. Cerebellum is unremarkable. Basal cisterns are patent. A small lacunar infarct is suspected in the left side of the pons, likely old. The right transverse dural venous sinus is more prominent than the left. No acute cortical ischemia or infarct. CT CERVICAL SPINE FINDINGS Evaluation of the cervical spine is limited due to patient body habitus and surgical hardware. There is reversal of normal lordosis at C5-6. No other malalignment is identified. Within limitations, no fractures are seen. Pedicle screws are seen from C3 through C7. A rod then extends inferiorly spanning the level of T2 which demonstrates a severe compression fracture which is age indeterminate but favore to be chronic. No other acute abnormalities. IMPRESSION: 1. The right transverse dural venous sinus is much more prominent than the left. While this may be normal for this patient, there are no comparisons and it would be difficult to exclude dural venous thrombosis on this noncontrast head CT. If there is concern, an MRV could better evaluate. 2. No other  acute intracranial abnormalities. 3. Evaluation of the cervical spine is limited due to surgical hardware and body habitus. No fractures or traumatic malalignment is identified. The reversal of normal lordosis at C5-6 is favored to be chronic. There is an age-indeterminate fracture through T2 with severe loss of height, also favored to be chronic. Findings are being called to the referring clinical team. I spoke to Dr. Junita Push. Electronically Signed   By: Dorise Bullion III M.D   On: 06/25/2016 20:01   Mr Brain Wo Contrast  06/26/2016  CLINICAL DATA:  Confusion, slurred speech, and gait impairment with difficulty walking. History of lymphoma. EXAM: MRI HEAD WITHOUT CONTRAST MRA HEAD WITHOUT CONTRAST TECHNIQUE: Multiplanar, multiecho pulse sequences of the brain and surrounding structures were obtained without intravenous contrast. Angiographic images of the head were obtained using MRA technique without contrast. COMPARISON:  Head CT 06/25/2016 FINDINGS: MRI HEAD FINDINGS Some sequences are mildly motion degraded despite repeated imaging. There are multiple subcentimeter foci of restricted diffusion consistent with acute infarcts involving the midbrain bilaterally as well as pons. There is also the suggestion of punctate acute infarcts in the right occipital lobe and near the posterior aspect of the lentiform nucleus. No intracranial mass hemorrhage, mass, midline shift, or extra-axial fluid collection is identified. There are chronic lacunar infarcts involving the basal ganglia and deep white matter tracts bilaterally, right thalamus, and both cerebral hemispheres and middle cerebellar peduncles. A small chronic left occipital cortical infarct is also present. There is mild global cerebral atrophy. Orbits are unremarkable. Paranasal sinuses and mastoid air cells are clear. There is an abnormal appearance of the dominant distal left vertebral artery which may reflect slow flow or occlusion. The basilar artery is  also abnormal in appearance with evidence of narrowing and diminished flow, more fully evaluated below. Limited visualization of the cervical spine demonstrates susceptibility artifact from extensive posterior fusion. MRA HEAD FINDINGS The study is mildly to moderately motion degraded. Flow related enhancement is partially visualized in the V3 segments of the vertebral arteries, with the  left vertebral artery dominant in size. No significant flow related enhancement is identified in the distal V3 or entirety of the V4 segments bilaterally. There is intermittent flow related enhancement in the PICAs. There is also only at most minimal intermittent flow related enhancement in the basilar artery. No significant enhancement is seen in the PCAs. Posterior communicating arteries are diminutive or absent. The internal carotid arteries are patent from skullbase to carotid termini. Evaluation for ICA stenosis is somewhat limited by motion though there is the suggestion of mild-to-moderate stenosis involving the anterior genu and proximal supraclinoid segments bilaterally. ACAs and MCAs are patent with moderate branch vessel irregular narrowing bilaterally. There is also evidence of mild-to-moderate proximal right M1 and mid left A1 stenoses with possible right ACA origin stenosis as well. No sizable intracranial aneurysm is identified. IMPRESSION: 1. Multiple small acute posterior circulation infarcts with greatest involvement of the medulla and pons. 2. Multiple chronic posterior circulation infarcts as well as chronic bilateral basal ganglia/ deep white matter infarcts. 3. Motion degraded MRA. Minimal to no flow related enhancement in the distal vertebral arteries, basilar artery, and PCAs. This may reflect severely diminished flow from underlying high-grade stenoses (less likely complete occlusion given lack of large acute infarcts). Consider CTA or catheter angiography for further evaluation. These results will be called  to the ordering clinician or representative by the Radiologist Assistant, and communication documented in the PACS or zVision Dashboard. Electronically Signed   By: Logan Bores M.D.   On: 06/26/2016 12:45   US Carotid Bilateral  06/26/2016  CLINICAL DATA:  Transient ischemic attack. EXAM: BILATERAL CAROTID DUPLEX ULTRASOUND TECHNIQUE: Pearline Cables scale imaging, color Doppler and duplex ultrasound were performed of bilateral carotid and vertebral arteries in the neck. COMPARISON:  Ultrasound of September 13, 2014. FINDINGS: Criteria: Quantification of carotid stenosis is based on velocity parameters that correlate the residual internal carotid diameter with NASCET-based stenosis levels, using the diameter of the distal internal carotid lumen as the denominator for stenosis measurement. The following velocity measurements were obtained: RIGHT ICA:  109/34 cm/sec CCA:  99991111 cm/sec SYSTOLIC ICA/CCA RATIO:  1.0 DIASTOLIC ICA/CCA RATIO:  2.6 ECA:  128 cm/sec LEFT ICA:  92/27 cm/sec CCA:  0000000 cm/sec SYSTOLIC ICA/CCA RATIO:  0.9 DIASTOLIC ICA/CCA RATIO:  2.2 ECA:  211 cm/sec RIGHT CAROTID ARTERY: Mild calcified plaque formation is noted in the right carotid bulb and proximal right internal carotid artery consistent with less than 50% diameter stenosis based on ultrasound and Doppler criteria. RIGHT VERTEBRAL ARTERY:  Antegrade flow is noted. LEFT CAROTID ARTERY: Mild eccentric calcified plaque is noted in the left carotid bulb and proximal left internal carotid artery consistent with less than 50% diameter stenosis based on ultrasound and Doppler criteria. LEFT VERTEBRAL ARTERY:  Antegrade flow is noted. IMPRESSION: Mild calcified plaque is noted in both carotid bulbs and proximal internal carotid arteries consistent with less than 50% diameter stenosis based on ultrasound and Doppler criteria. This is not significantly changed compared to prior exam. Electronically Signed   By: Marijo Conception, M.D.   On: 06/26/2016 11:02    Dg Chest Port 1 View  06/26/2016  CLINICAL DATA:  70 year old male with pneumonia EXAM: PORTABLE CHEST 1 VIEW COMPARISON:  Chest radiograph dated 10/11/2013 FINDINGS: Single portable view of the chest demonstrates emphysematous changes of the lungs. There is no focal consolidation, pleural effusion, or pneumothorax. The cardiac silhouette is within normal limits. Coronary vascular calcification or stents noted. No acute osseous pathology. Lower cervical and upper  thoracic posterior fixation hardware. IMPRESSION: No active disease. Electronically Signed   By: Anner Crete M.D.   On: 06/26/2016 02:41   Mr Jodene Nam Head/brain Wo Cm  06/26/2016  CLINICAL DATA:  Confusion, slurred speech, and gait impairment with difficulty walking. History of lymphoma. EXAM: MRI HEAD WITHOUT CONTRAST MRA HEAD WITHOUT CONTRAST TECHNIQUE: Multiplanar, multiecho pulse sequences of the brain and surrounding structures were obtained without intravenous contrast. Angiographic images of the head were obtained using MRA technique without contrast. COMPARISON:  Head CT 06/25/2016 FINDINGS: MRI HEAD FINDINGS Some sequences are mildly motion degraded despite repeated imaging. There are multiple subcentimeter foci of restricted diffusion consistent with acute infarcts involving the midbrain bilaterally as well as pons. There is also the suggestion of punctate acute infarcts in the right occipital lobe and near the posterior aspect of the lentiform nucleus. No intracranial mass hemorrhage, mass, midline shift, or extra-axial fluid collection is identified. There are chronic lacunar infarcts involving the basal ganglia and deep white matter tracts bilaterally, right thalamus, and both cerebral hemispheres and middle cerebellar peduncles. A small chronic left occipital cortical infarct is also present. There is mild global cerebral atrophy. Orbits are unremarkable. Paranasal sinuses and mastoid air cells are clear. There is an abnormal  appearance of the dominant distal left vertebral artery which may reflect slow flow or occlusion. The basilar artery is also abnormal in appearance with evidence of narrowing and diminished flow, more fully evaluated below. Limited visualization of the cervical spine demonstrates susceptibility artifact from extensive posterior fusion. MRA HEAD FINDINGS The study is mildly to moderately motion degraded. Flow related enhancement is partially visualized in the V3 segments of the vertebral arteries, with the left vertebral artery dominant in size. No significant flow related enhancement is identified in the distal V3 or entirety of the V4 segments bilaterally. There is intermittent flow related enhancement in the PICAs. There is also only at most minimal intermittent flow related enhancement in the basilar artery. No significant enhancement is seen in the PCAs. Posterior communicating arteries are diminutive or absent. The internal carotid arteries are patent from skullbase to carotid termini. Evaluation for ICA stenosis is somewhat limited by motion though there is the suggestion of mild-to-moderate stenosis involving the anterior genu and proximal supraclinoid segments bilaterally. ACAs and MCAs are patent with moderate branch vessel irregular narrowing bilaterally. There is also evidence of mild-to-moderate proximal right M1 and mid left A1 stenoses with possible right ACA origin stenosis as well. No sizable intracranial aneurysm is identified. IMPRESSION: 1. Multiple small acute posterior circulation infarcts with greatest involvement of the medulla and pons. 2. Multiple chronic posterior circulation infarcts as well as chronic bilateral basal ganglia/ deep white matter infarcts. 3. Motion degraded MRA. Minimal to no flow related enhancement in the distal vertebral arteries, basilar artery, and PCAs. This may reflect severely diminished flow from underlying high-grade stenoses (less likely complete occlusion given  lack of large acute infarcts). Consider CTA or catheter angiography for further evaluation. These results will be called to the ordering clinician or representative by the Radiologist Assistant, and communication documented in the PACS or zVision Dashboard. Electronically Signed   By: Logan Bores M.D.   On: 06/26/2016 12:45    ASSESSMENT AND PLAN:   Principal Problem:   Altered mental status  * Acute stroke   Stroke work ups.   MR angio brain- multiple vascular narrowings.   ASA, Statin   Carotid doppler and Echocardiogram.   Neurology consult appreciated.   May need rehab  placement.  * Dysphagia   Speech consult.   MBSS done today- awaited result.   Keep NPO except meds with apple sauce.  * Hypertension   Stable.  * Hypothyroidism   Levothyroxine  * Hyperlipidemia   Cont statin  All the records are reviewed and case discussed with Care Management/Social Workerr. Management plans discussed with the patient, family and they are in agreement.  CODE STATUS: Full  TOTAL TIME TAKING CARE OF THIS PATIENT: 35 minutes.     POSSIBLE D/C IN 1-2 DAYS, DEPENDING ON CLINICAL CONDITION.   Vaughan Basta M.D on 06/27/2016   Between 7am to 6pm - Pager - [phone]  After 6pm go to [email or web address] - password [email or web address] [facility]  Office  [phone]  CC: Primary care physician; Halina Maidens, MD  Note: This dictation was prepared with Dragon dictation along with smaller phrase technology. Any transcriptional errors that result from this process are unintentional.

## 2016-06-27 NOTE — Evaluation (Signed)
Occupational Therapy Evaluation Patient Details Name: Kyle Choi MRN: [id number] DOB: [birth year] Today's Date: 06/27/2016    History of Present Illness 70 yo M presented to ED for weakness, confusion, lethargy, decreased appetite, slurred speech and was found on the floor by his daughter the day of arrival to ED. 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 PMH includes cancer, heart disease, coronary angioplasty.   Clinical Impression   Pt is 70 year old male who presents with CVA as indicated above.  Pt presents with lability and needs redirection and encouragement to participate in session.  He has expressive aphasia with garbled speech when answering questions. He is right hand dominant with LUE and hand with decreased coordination and functional use for ADLs.  Pt has poor problem solving skills and sequencing skills during bed mobility and ADLs.  Pt is able to complete automatic tasks but tasks requiring planning are difficult.  He needed total assist for LB dressing sitting EOB due to decreased balance and at risk for falls.  Pt is able to feed himself after set and opening containers.  He performs hygiene tasks with extra time and cues using R hand.  Pt has full PROM of LUE and hand with some active movement in hand and elbow only.  Rec continued OT while in hospital to continue to work on increasing independence in ADLs, balance and functional mobility training, coordination exercises and family ed and training. He would be a good candidate for CIR.    Follow Up Recommendations  CIR    Equipment Recommendations   (to be assessed based on DC plan)    Recommendations for Other Services       Precautions / Restrictions Precautions Precautions: Fall Precaution Comments: expresssive aphasia and labile Restrictions Weight Bearing Restrictions: No      Mobility Bed Mobility                  Transfers                       Balance                                            ADL Overall ADL's : Needs assistance/impaired Eating/Feeding: Set up;Moderate assistance Eating/Feeding Details (indicate cue type and reason): one handed techniques mostly due to decreased control of L hand for opening containers Grooming: Wash/dry hands;Wash/dry face;Oral care;Brushing hair;Set up;Minimal assistance           Upper Body Dressing : Set up;Moderate assistance;Cueing for sequencing;Cueing for compensatory techniques   Lower Body Dressing: Set up;Total assistance;+2 for physical assistance;+2 for safety/equipment;Cueing for safety;Sit to/from stand                 General ADL Comments: Pt is able to use L hand only to grasp items but lacks control of UE for ADLs.  He is labile and needs redirection and time to complete tasks; impaired sequencing and mod assist for bed mobility and min/mod for balance when sitting EOB; Unable to reach feet  safely for LB dressing and needs total assist at this time     Vision     Perception     Praxis      Pertinent Vitals/Pain Pain Assessment: No/denies pain     Hand Dominance Right  Extremity/Trunk Assessment Upper Extremity Assessment Upper Extremity Assessment: LUE deficits/detail LUE Deficits / Details: hx of amputated L index finger; AROM in L elblw flexion and extension 30 degrees; minimal finger extension but able to make a fist with strength 2/5; full PROM of L shoulder with no pain LUE Coordination: decreased fine motor;decreased gross motor   Lower Extremity Assessment Lower Extremity Assessment: Defer to PT evaluation   Cervical / Trunk Assessment Cervical / Trunk Assessment: Kyphotic   Communication Communication Communication: Expressive difficulties;Other (comment) (labile)   Cognition Arousal/Alertness: Awake/alert Behavior During Therapy:  (labile but able to be redirected) Overall Cognitive Status: No family/caregiver  present to determine baseline cognitive functioning                     General Comments       Exercises       Shoulder Instructions      Home Living Family/patient expects to be discharged to:: Private residence Living Arrangements: Children Available Help at Discharge: Family Type of Home: House Home Access: Stairs to enter Technical brewer of Steps: 2 Entrance Stairs-Rails: None Home Layout: One level     Bathroom Shower/Tub: Tub/shower unit Shower/tub characteristics: Architectural technologist: Standard Bathroom Accessibility: Yes How Accessible: Accessible via walker Home Equipment: Gilford Rile - 2 wheels   Additional Comments: Pt lives with his daughter.      Prior Functioning/Environment Level of Independence: Independent with assistive device(s)        Comments: Pt has most recently been using a FWW for ambulation. He stated it was the past few days and prior to that he wasn't using an AD. Unsure of accuracy of the timeline. Pt independent with ADLs, driving.     OT Diagnosis: Generalized weakness;Paresis;Hemiplegia non-dominant side;Cognitive deficits   OT Problem List: Decreased strength;Decreased range of motion;Decreased activity tolerance;Impaired balance (sitting and/or standing);Decreased safety awareness;Decreased coordination;Impaired tone;Impaired UE functional use   OT Treatment/Interventions: Self-care/ADL training;Neuromuscular education;Patient/family education;Therapeutic activities;Balance training    OT Goals(Current goals can be found in the care plan section) Acute Rehab OT Goals Patient Stated Goal: to be able to do more for myself OT Goal Formulation: With patient Time For Goal Achievement: 07/11/16 Potential to Achieve Goals: Good ADL Goals Pt Will Perform Grooming: with set-up;with min assist;sitting Pt Will Perform Upper Body Dressing: with set-up;with min assist;sitting Pt Will Perform Lower Body Dressing: with set-up;sit  to/from stand;with mod assist (with no LOB standing over hips) Pt Will Transfer to Toilet: with set-up;with mod assist;bedside commode (BSC over toilet) Pt/caregiver will Perform Home Exercise Program: Left upper extremity;Increased ROM;Increased strength;With theraputty;With minimal assist;With written HEP provided  OT Frequency: Min 1X/week   Barriers to D/C:            Co-evaluation              End of Session    Activity Tolerance: Patient tolerated treatment well Patient left: in bed;with call bell/phone within reach;with bed alarm set   Time: 1400-1445 OT Time Calculation (min): 45 min Charges:  OT General Charges $OT Visit: 1 Procedure OT Evaluation $OT Eval High Complexity: 1 Procedure OT Treatments $Self Care/Home Management : 8-22 mins $Neuromuscular Re-education: 8-22 mins G-Codes:     Chrys Racer, OTR/L ascom [phone] 06/27/2016, 3:56 PM

## 2016-06-28 MED ORDER — CLOPIDOGREL BISULFATE 75 MG PO TABS: 75.0000 mg | ORAL_TABLET | Freq: Every day | ORAL | Status: DC

## 2016-06-28 MED ORDER — ASPIRIN EC 81 MG PO TBEC
81.0000 mg | DELAYED_RELEASE_TABLET | Freq: Every day | ORAL | Status: DC
  Administered 2016-06-29: 11:00:00 81 mg via ORAL
  Filled 2016-06-28: qty 1

## 2016-06-28 NOTE — NC FL2 (Signed)
[place] LEVEL OF CARE SCREENING TOOL     IDENTIFICATION  Patient Name: Kyle Choi Birthdate: 27-Nov-1946 Sex: male Admission Date (Current Location): 06/25/2016  [facility] and [place] Number:  Selena Lesser  ([id number] [facility]) Facility and Address:  [facility], [address] [place], [place] [address]      Provider Number: [id number]  Attending Physician Name and Address:  Vaughan Basta, MD  Relative Name and Phone Number:       Current Level of Care: Hospital Recommended Level of Care: [place] Prior Approval Number:    Date Approved/Denied:   PASRR Number:  ([id number])  Discharge Plan: SNF    Current Diagnoses: Patient Active Problem List   Diagnosis Date Noted  . Altered mental status 06/26/2016  . CAD in native artery 06/20/2015  . Lymphoma in remission ([place]) 06/20/2015  . Abnormal LFTs 06/20/2015  . Essential (primary) hypertension 06/20/2015  . Hypothyroidism 06/20/2015  . Combined fat and carbohydrate induced hyperlipemia 06/20/2015    Orientation RESPIRATION BLADDER Height & Weight     Self, Situation  Normal Continent Weight: 160 lb 6.4 oz (72.757 kg) Height:  5\' 2"  (157.5 cm)  BEHAVIORAL SYMPTOMS/MOOD NEUROLOGICAL BOWEL NUTRITION STATUS     (None) Continent Diet (DYS 2 Fluid consistency:: Nectar Thick )  AMBULATORY STATUS COMMUNICATION OF NEEDS Skin   Extensive Assist Verbally Normal                       Personal Care Assistance Level of Assistance  Bathing, Feeding, Dressing Bathing Assistance: Limited assistance Feeding assistance: Limited assistance Dressing Assistance: Limited assistance     Functional Limitations Info  Sight, Hearing, Speech Sight Info: Impaired Hearing Info: Adequate Speech Info: Adequate    SPECIAL CARE FACTORS FREQUENCY  PT (By licensed PT), OT (By licensed OT), Speech therapy     PT Frequency:  (5) OT Frequency:  (5)     Speech Therapy  Frequency:  (5)      Contractures      Additional Factors Info  Code Status, Allergies Code Status Info:  (Full Code) Allergies Info:  (Ace Inhibitors)           Current Medications (06/28/2016):  This is the current hospital active medication list Current Facility-Administered Medications  Medication Dose Route Frequency Provider Last Rate Last Dose  . 0.9 %  sodium chloride infusion   Intravenous Continuous Saundra Shelling, MD 75 mL/hr at 06/28/16 0807    . antiseptic oral rinse (CPC / CETYLPYRIDINIUM CHLORIDE 0.05%) solution 7 mL  7 mL Mouth Rinse q12n4p Vaughan Basta, MD   7 mL at 06/27/16 1620  . aspirin suppository 300 mg  300 mg Rectal Daily Pavan Pyreddy, MD       Or  . aspirin tablet 325 mg  325 mg Oral Daily Pavan Pyreddy, MD   325 mg at 06/28/16 1022  . atorvastatin (LIPITOR) tablet 40 mg  40 mg Oral q1800 Vaughan Basta, MD   40 mg at 06/27/16 1824  . chlorhexidine (PERIDEX) 0.12 % solution 15 mL  15 mL Mouth Rinse BID Vaughan Basta, MD   15 mL at 06/28/16 1023  . clopidogrel (PLAVIX) tablet 75 mg  75 mg Oral Daily Alexis Goodell, MD   75 mg at 06/28/16 1022  . enoxaparin (LOVENOX) injection 40 mg  40 mg Subcutaneous Q24H Saundra Shelling, MD   40 mg at 06/27/16 2158  . ipratropium-albuterol (DUONEB) 0.5-2.5 (3) MG/3ML nebulizer solution 3 mL  3 mL Nebulization Q4H PRN Lance Coon, MD   3 mL at 06/26/16 2248  . levothyroxine (SYNTHROID, LEVOTHROID) tablet 100 mcg  100 mcg Oral QAC breakfast Saundra Shelling, MD   100 mcg at 06/28/16 0759  . losartan (COZAAR) tablet 50 mg  50 mg Oral Daily Pavan Pyreddy, MD   50 mg at 06/28/16 1022  . metoprolol tartrate (LOPRESSOR) tablet 12.5 mg  12.5 mg Oral BID Saundra Shelling, MD   12.5 mg at 06/28/16 1022     Discharge Medications: Please see discharge summary for a list of discharge medications.  Relevant Imaging Results:  Relevant Lab Results:   Additional Information  (SSN [id number])  Darden Dates,  LCSW

## 2016-06-28 NOTE — Clinical Social Work Note (Signed)
Clinical Social Work Assessment  Patient Details  Name: Kyle Choi MRN: [id number] Date of Birth: [birth year]  Date of referral:  06/28/16               Reason for consult:  Facility Placement                Permission sought to share information with:  Family Supports Permission granted to share information::  Yes, Verbal Permission Granted  Name::     Kyle Choi  Relationship::  daughter  Contact Information:  [phone]  Housing/Transportation Living arrangements for the past 2 months:  Single Family Home Source of Information:  Adult Children Patient Interpreter Needed:  None Criminal Activity/Legal Involvement Pertinent to Current Situation/Hospitalization:  No - Comment as needed Significant Relationships:  Adult Children Lives with:  Self Do you feel safe going back to the place where you live?  No (Pt needs a higher level of care.) Need for family participation in patient care:  Yes (Comment)  Care giving concerns:  Pt is unsafe to return home.    Social Worker assessment / plan:  CSW met with pt and daughter to address consult. CSW introduced herself and explained role of social work. CSW also explained the process of discharging to SNF with Medicare. PT has recommending CIR, however pt's family declined as they would like for pt to obtain therapy locally. Pt is not oriented, and is tearful which is not pt's baseline. CSW initiated a SNF search. CSW also presented bed offers. Pt's daughter chose [facility]. CSW updated facility. Per MD, pt will be ready for discharge tomorrow, therefor CSW sent the discharge summary. CSW will continue to follow.    Employment status:  Retired Forensic scientist:  Medicare/Medicaid PT Recommendations:  Inpatient Rehab Consult (family declined and wanted pt local) Information / Referral to community resources:  [place]  Patient/Family's Response to care:  Pt's daughter was Patent attorney of CSW support.    Patient/Family's Understanding of and Emotional Response to Diagnosis, Current Treatment, and Prognosis:  Pt's daughter understands that pt needs STR and possible LTC.   Emotional Assessment Appearance:  Appears stated age Attitude/Demeanor/Rapport:  Crying Affect (typically observed):  Tearful/Crying Orientation:  Oriented to Self, Fluctuating Orientation (Suspected and/or reported Sundowners) Alcohol / Substance use:  Never Used Psych involvement (Current and /or in the community):  No (Comment)  Discharge Needs  Concerns to be addressed:  Adjustment to Illness Readmission within the last 30 days:  No Current discharge risk:  Chronically ill Barriers to Discharge:  Continued Medical Work up   [employer], LCSW 06/28/2016, 8:09 PM

## 2016-06-28 NOTE — Care Management (Signed)
Per Danne Baxter with CIR family has decided that patient can go to SNF here in [facility] instead of inpatient rehab. I have notified CSW and Dr. Anselm Jungling of this plan/need.

## 2016-06-28 NOTE — Plan of Care (Signed)
Problem: SLP Dysphagia Goals Goal: Misc Dysphagia Goal Pt will safely tolerate po diet of least restrictive consistency w/ no overt s/s of aspiration noted by Staff/pt/family x3 sessions.    

## 2016-06-28 NOTE — Plan of Care (Signed)
Problem: Education: Goal: Knowledge of disease or condition will improve Outcome: Progressing Gave handout for stroke prevention; needs reinforcement.

## 2016-06-28 NOTE — Progress Notes (Signed)
Physical Therapy Treatment Patient Details Name: Kyle Choi MRN: [id number] DOB: [birth year] Today's Date: 06/28/2016    History of Present Illness 70 yo M presented to ED for weakness, confusion, lethargy, decreased appetite, slurred speech and was found on the floor by his daughter the day of arrival to ED. 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 PMH includes cancer, heart disease, coronary angioplasty.    PT Comments    Pt demonstrated some improvements during this session. He was able to perform the first part of supine to sit but still needed mod A to get trunk upright. Seated balance fair requiring min guard. Transfers performed with min A +2 and FWW. Pt demonstrated improved ability to maintain standing position this session. Plan to progress strengthening and ambulation as tolerated.   Follow Up Recommendations  CIR     Equipment Recommendations       Recommendations for Other Services       Precautions / Restrictions Precautions Precautions: Fall Precaution Comments: expresssive aphasia  Restrictions Weight Bearing Restrictions: No    Mobility  Bed Mobility Overal bed mobility: Needs Assistance Bed Mobility: Supine to Sit     Supine to sit: Mod assist;HOB elevated     General bed mobility comments: assistance to place hand on rail, cues for rolling and bringing legs of EOB, difficulty getting trunk upright  Transfers Overall transfer level: Needs assistance Equipment used: Rolling walker (2 wheeled) Transfers: Sit to/from [facility] Sit to Stand: Min assist;+2 physical assistance;From elevated surface Stand pivot transfers: Min assist;+2 physical assistance;From elevated surface       General transfer comment: posterior lean, difficulty lifting and advancing L LE, decreased coordination and ability to process technique  Ambulation/Gait             General Gait Details: Nt this  session   Stairs            Wheelchair Mobility    Modified Rankin (Stroke Patients Only)       Balance Overall balance assessment: Needs assistance;History of Falls Sitting-balance support: Bilateral upper extremity supported;Feet supported Sitting balance-[name] Scale: Fair Sitting balance - Comments: min guard   Standing balance support: Bilateral upper extremity supported Standing balance-[name] Scale: Poor Standing balance comment: mod A to maintain                    Cognition Arousal/Alertness: Awake/alert Behavior During Therapy: WFL for tasks assessed/performed Overall Cognitive Status: Within Functional Limits for tasks assessed                      Exercises Other Exercises Other Exercises: B LE supine therex: ankle pumps, heel slides, hip abd slides x10 each. Cues and AAROM withi L LE for proper technique and full ROM. Attempted standing marching with little success. Other Exercises: Transfers from bed to chair with FWW and min A +2. Still has difficulty lifting and advacing LEs, but somewhat improved from yesterday.  Other Exercises: Multiple static standing x~30 sec, 63min and 3 min with min A +2 and FWW. Cues for postural correction, anterior weight shifting and knee extension to improve balance.    General Comments General comments (skin integrity, edema, etc.): emotional at times      Pertinent Vitals/Pain Pain Assessment: No/denies pain    Home Living  Prior Function            PT Goals (current goals can now be found in the care plan section) Acute Rehab PT Goals Patient Stated Goal: to be able to do more for myself PT Goal Formulation: With patient Time For Goal Achievement: 07/11/16 Potential to Achieve Goals: Fair Progress towards PT goals: Progressing toward goals    Frequency  7X/week    PT Plan Current plan remains appropriate    Co-evaluation             End of Session  Equipment Utilized During Treatment: Gait belt Activity Tolerance: Patient tolerated treatment well;Patient limited by fatigue Patient left: in chair;with call bell/phone within reach;with chair alarm set;with family/visitor present     Time: [id number] PT Time Calculation (min) (ACUTE ONLY): 24 min  Charges:  $Therapeutic Exercise: 8-22 mins $Neuromuscular Re-education: 8-22 mins                    G Codes:      Neoma Laming, PT, DPT  06/28/2016, 4:38 PM [phone]

## 2016-06-28 NOTE — Discharge Summary (Addendum)
[facility] Choi at [place] NAME: Kyle Choi    MR#:  [id number]  DATE OF BIRTH:  [birth year]  DATE OF ADMISSION:  06/25/2016 ADMITTING PHYSICIAN: Saundra Shelling, MD  DATE OF DISCHARGE: 06/29/16 PRIMARY CARE PHYSICIAN: Halina Maidens, MD    ADMISSION DIAGNOSIS:  TIA (transient ischemic attack) [G45.9] Pneumonia [J18.9] Fall [W19.XXXA] Altered mental status, unspecified altered mental status type [R41.82]  DISCHARGE DIAGNOSIS:  Principal Problem:   Altered mental status Active Problems:   Acute cerebral infarction [facility])   SECONDARY DIAGNOSIS:   Past Medical History  Diagnosis Date  . Cancer ([place])   . Lymphoma ([place])   . Heart disease     HOSPITAL COURSE:   * Acute stroke  Stroke work ups.  MR angio brain- multiple vascular narrowings.  ASA, Statin  Carotid doppler and Echocardiogram.  Neurology consult appreciated.  need rehab placement.   * Dysphagia  Speech consult.  MBSS done and diet ordered as per Swallow tech.   As per swellow tech- " Goal: Misc Dysphagia Goal Pt will safely tolerate po diet of least restrictive consistency w/ no overt s/s of aspiration noted by Staff/pt/family x3 sessions. "  * Hypertension  Stable.  * Hypothyroidism  Levothyroxine  * Hyperlipidemia  Cont statin  DISCHARGE CONDITIONS:   Stable.  CONSULTS OBTAINED:  Treatment Team:  Alexis Goodell, MD  DRUG ALLERGIES:   Allergies  Allergen Reactions  . Ace Inhibitors Cough    DISCHARGE MEDICATIONS:   Current Discharge Medication List    START taking these medications   Details  clopidogrel (PLAVIX) 75 MG tablet Take 1 tablet (75 mg total) by mouth daily. Qty: 30 tablet, Refills: 0      CONTINUE these medications which have NOT CHANGED   Details  aspirin 81 MG tablet Take 1 tablet by mouth daily.    atorvastatin (LIPITOR) 40 MG tablet TAKE ONE TABLET BY MOUTH ONCE DAILY Qty: 30 tablet, Refills: 0     levothyroxine (SYNTHROID, LEVOTHROID) 100 MCG tablet TAKE ONE TABLET BY MOUTH ONCE DAILY Qty: 30 tablet, Refills: 5    losartan (COZAAR) 50 MG tablet TAKE ONE TABLET BY MOUTH ONCE DAILY Qty: 30 tablet, Refills: 5    metoprolol tartrate (LOPRESSOR) 25 MG tablet Take 0.5 tablets (12.5 mg total) by mouth 2 (two) times daily. Qty: 30 tablet, Refills: 5    potassium chloride 20 MEQ/15ML (10%) SOLN Take 7.5 mLs (10 mEq total) by mouth 2 (two) times daily. Qty: 450 mL, Refills: 5         DISCHARGE INSTRUCTIONS:    Follow with PMD in 1-2 weeks.  If you experience worsening of your admission symptoms, develop shortness of breath, life threatening emergency, suicidal or homicidal thoughts you must seek medical attention immediately by calling 911 or calling your MD immediately  if symptoms less severe.  You Must read complete instructions/literature along with all the possible adverse reactions/side effects for all the Medicines you take and that have been prescribed to you. Take any new Medicines after you have completely understood and accept all the possible adverse reactions/side effects.   Please note  You were cared for by a hospitalist during your hospital stay. If you have any questions about your discharge medications or the care you received while you were in the hospital after you are discharged, you can call the unit and asked to speak with the hospitalist on call if the hospitalist that took care of you is not available.  Once you are discharged, your primary care physician will handle any further medical issues. Please note that NO REFILLS for any discharge medications will be authorized once you are discharged, as it is imperative that you return to your primary care physician (or establish a relationship with a primary care physician if you do not have one) for your aftercare needs so that they can reassess your need for medications and monitor your lab values.    Today    CHIEF COMPLAINT:   Chief Complaint  Patient presents with  . Altered Mental Status    HISTORY OF PRESENT ILLNESS:  Kyle Choi  is a 70 y.o. male with a known history of Lymphoma, heart disease presented to the emergency room with confusion for the last 3 days. Patient was accompanied by family members, according to them patient has been confused and having slurred speech for the last 3 days. He also had impaired gait and difficulty walking. Patient usually is able to walk by himself and he has a walker at home. No history of any fall or head injury. No history of any seizure. Patient never had any stroke in the past according to family members. Patient was evaluated in the emergency room with a CT head which showed no acute intracranial abnormality. He had an elevated WBC count but urine did not show any infection. Hospitalist service was consulted for further care of the patient. Patient is awake not completely oriented to time place and person. Not able to give much history.   VITAL SIGNS:  Blood pressure 150/65, pulse 68, temperature 98.3 F (36.8 C), temperature source Oral, resp. rate 18, height 5\' 2"  (1.575 m), weight 72.757 kg (160 lb 6.4 oz), SpO2 99 %.  I/O:    Intake/Output Summary (Last 24 hours) at 06/29/16 0950 Last data filed at 06/29/16 0615  Gross per 24 hour  Intake   1010 ml  Output      0 ml  Net   1010 ml    PHYSICAL EXAMINATION:  GENERAL: 70 y.o.-year-old patient lying in the bed with no acute distress.  EYES: Pupils equal, round, reactive to light and accommodation. No scleral icterus. Extraocular muscles intact.  HEENT: Head atraumatic, normocephalic. Oropharynx and nasopharynx clear.  NECK: Supple, no jugular venous distention. No thyroid enlargement, no tenderness.  LUNGS: Normal breath sounds bilaterally, no wheezing, rales,rhonchi or crepitation. No use of accessory muscles of respiration.  CARDIOVASCULAR: S1, S2 normal. No murmurs, rubs, or  gallops.  ABDOMEN: Soft, nontender, nondistended. Bowel sounds present. No organomegaly or mass.  EXTREMITIES: No pedal edema, cyanosis, or clubbing.  NEUROLOGIC: Cranial nerves II through XII are intact. Muscle strength 5/5 in right sided extremities, left side 3-4/5 power. Sensation intact. Gait not checked.  PSYCHIATRIC: The patient is alert and oriented x 3.  SKIN: No obvious rash, lesion, or ulcer.   DATA REVIEW:   CBC  Recent Labs Lab 06/26/16 0534  WBC 14.2*  HGB 14.2  HCT 42.4  PLT 263    Chemistries   Recent Labs Lab 06/25/16 1840 06/26/16 0534  NA 135  --   K 4.0  --   CL 99*  --   CO2 25  --   GLUCOSE 108*  --   BUN 16  --   CREATININE 1.28* 1.29*  CALCIUM 9.4  --   AST 22  --   ALT 14*  --   ALKPHOS 114  --   BILITOT 1.6*  --     Cardiac  Enzymes  Recent Labs Lab 06/25/16 1840  TROPONINI 0.08*    Microbiology Results  No results found for this or any previous visit.  RADIOLOGY:  No results found.  EKG:   Orders placed or performed during the hospital encounter of 06/25/16  . EKG 12-Lead  . EKG 12-Lead  . ED EKG  . ED EKG      Management plans discussed with the patient, family and they are in agreement.  CODE STATUS:     Code Status Orders        Start     Ordered   06/26/16 0333  Full code   Continuous     06/26/16 0332    Code Status History    Date Active Date Inactive Code Status Order ID Comments User Context   This patient has a current code status but no historical code status.      TOTAL TIME TAKING CARE OF THIS PATIENT: 35 minutes.    Vaughan Basta M.D on 06/29/2016 at 9:50 AM  Between 7am to 6pm - Pager - [phone]  After 6pm go to [email or web address] - password [email or web address] [place] [facility]  Office  [phone]  CC: Primary care physician; Halina Maidens, MD   Note: This dictation was prepared with Dragon dictation along with smaller phrase technology. Any transcriptional  errors that result from this process are unintentional.

## 2016-06-28 NOTE — Progress Notes (Signed)
[facility] [place] NAME: Kyle Choi    MR#:  [id number]  DATE OF BIRTH:  08-15-1946  SUBJECTIVE:  CHIEF COMPLAINT:   Chief Complaint  Patient presents with  . Altered Mental Status   Came with speech problem and left sided weakness for last 2-3 days. 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   No new complains today started PT.  REVIEW OF SYSTEMS:  CONSTITUTIONAL: No fever, fatigue or weakness.  EYES: No blurred or double vision.  EARS, NOSE, AND THROAT: No tinnitus or ear pain.  RESPIRATORY: No cough, shortness of breath, wheezing or hemoptysis.  CARDIOVASCULAR: No chest pain, orthopnea, edema.  GASTROINTESTINAL: No nausea, vomiting, diarrhea or abdominal pain.  GENITOURINARY: No dysuria, hematuria.  ENDOCRINE: No polyuria, nocturia,  HEMATOLOGY: No anemia, easy bruising or bleeding SKIN: No rash or lesion. MUSCULOSKELETAL: No joint pain or arthritis.   NEUROLOGIC: No tingling, numbness, left sided weakness.  PSYCHIATRY: No anxiety or depression.   ROS  DRUG ALLERGIES:   Allergies  Allergen Reactions  . Ace Inhibitors Cough    VITALS:  Blood pressure 154/84, pulse 74, temperature 98.1 F (36.7 C), temperature source Oral, resp. rate 20, height 5\' 2"  (1.575 m), weight 72.757 kg (160 lb 6.4 oz), SpO2 98 %.  PHYSICAL EXAMINATION:  GENERAL:  70 y.o.-year-old patient lying in the bed with no acute distress.  EYES: Pupils equal, round, reactive to light and accommodation. No scleral icterus. Extraocular muscles intact.  HEENT: Head atraumatic, normocephalic. Oropharynx and nasopharynx clear.  NECK:  Supple, no jugular venous distention. No thyroid enlargement, no tenderness.  LUNGS: Normal breath sounds bilaterally, no wheezing, rales,rhonchi or crepitation. No use of accessory muscles of respiration.  CARDIOVASCULAR: S1, S2 normal. No murmurs, rubs, or gallops.  ABDOMEN: Soft, nontender, nondistended. Bowel  sounds present. No organomegaly or mass.  EXTREMITIES: No pedal edema, cyanosis, or clubbing.  NEUROLOGIC: Cranial nerves II through XII are intact. Muscle strength 5/5 in right sided extremities, left side 3-4/5 power. Sensation intact. Gait not checked.  PSYCHIATRIC: The patient is alert and oriented x 3.  SKIN: No obvious rash, lesion, or ulcer.   Physical Exam LABORATORY PANEL:   CBC  Recent Labs Lab 06/26/16 0534  WBC 14.2*  HGB 14.2  HCT 42.4  PLT 263   ------------------------------------------------------------------------------------------------------------------  Chemistries   Recent Labs Lab 06/25/16 1840 06/26/16 0534  NA 135  --   K 4.0  --   CL 99*  --   CO2 25  --   GLUCOSE 108*  --   BUN 16  --   CREATININE 1.28* 1.29*  CALCIUM 9.4  --   AST 22  --   ALT 14*  --   ALKPHOS 114  --   BILITOT 1.6*  --    ------------------------------------------------------------------------------------------------------------------  Cardiac Enzymes  Recent Labs Lab 06/25/16 1840  TROPONINI 0.08*   ------------------------------------------------------------------------------------------------------------------  RADIOLOGY:  Ct Angio Head W Or Wo Contrast  06/26/2016  CLINICAL DATA:  Confusion over the last 3 days. Slurred speech. Impaired gait and difficulty walking. Acute nonhemorrhagic infarcts involving the brainstem and right occipital lobe. EXAM: CT ANGIOGRAPHY HEAD AND NECK TECHNIQUE: Multidetector CT imaging of the head and neck was performed using the standard protocol during bolus administration of intravenous contrast. Multiplanar CT image reconstructions and MIPs were obtained to evaluate the vascular anatomy. Carotid stenosis measurements (when applicable) are obtained utilizing NASCET criteria, using the distal internal carotid diameter as the denominator. CONTRAST:  75 mL Isovue 370 COMPARISON:  MRI brain and MRA head from the same day. FINDINGS: CTA  NECK Aortic arch: Moderate atherosclerotic changes are present at the origins of the great vessels without significant stenosis relative to the more distal vessels. Right carotid system: Atherosclerotic changes are noted at the bifurcation of the innominate artery. There is wall thickening in the distal right common carotid artery. Atherosclerotic changes are present at the right carotid bifurcation. There is some irregularity of the proximal right ICA. Mild tortuosity is present in the more distal right ICA without significant stenosis. Left carotid system: The left common carotid artery demonstrates multifocal atherosclerotic change without a significant stenosis. Extensive calcification and noncalcified plaque is present at the carotid bifurcation without significant stenosis. There is mild tortuosity of the distal cervical left ICA. Calcifications are present in the distal cervical left ICA just proximal to the skullbase. This may represent a previous traumatic injury without a significant residual stenosis. Vertebral arteries:The vertebral arteries both originate from the subclavian arteries. Left vertebral artery is dominant. There is some artifact from cervical spinal fusion. No significant vertebral artery stenosis is evident in the neck. Skeleton: Extensive posterior cervical thoracic fusion is present. This extends superiorly to C3. There is significant lucency surrounding the posterior element screws at C3 suggesting movement. There is no lucency at the level of C4 below. Grade 1 retrolisthesis is present at C5-6. The severe remote compression fracture is present at T2. The construct ends on the right at T3 that extends to a least T4 and T5 on the left. Other neck: No focal mucosal or submucosal lesions are present. The parotid glands are somewhat atrophic. The submandibular glands are within normal limits bilaterally. The thyroid is markedly atrophic. No significant cervical adenopathy is present. CTA  HEAD Anterior circulation: Dense calcifications are present within the cavernous internal carotid arteries bilaterally. There is mild narrowing bilaterally. This appears less severe than on the MRA. MRA was artifactually exaggerated by calcifications and patient motion. The A1 and M1 segments are within normal limits. The anterior communicating artery is patent. MCA bifurcations are intact. There is mild narrowing of proximal M2 segment on the right. There is some attenuation of distal MCA and ACA branch vessels bilaterally without a significant proximal stenosis or occlusion. No aneurysm is present. Posterior circulation: Moderate stenosis present within the non dominant right vertebral artery at the dural margin. The right vertebral artery terminates at the PICA. Is calcification and severe stenosis of the left vertebral artery at the dural margin. The left vertebral artery is occluded proximal to the vertebrobasilar junction. The proximal basilar artery is occluded. A small distal basilar artery is reconstituted. This may represent retrograde flow via posterior communicating arteries. The posterior cerebral arteries are both moderately attenuated. Venous sinuses: The dural sinuses are patent. Anatomic variants: Posterior communicating arteries bilaterally feed the basilar tip Delayed phase: Postcontrast images demonstrate a remote encephalomalacia of the medial left occipital lobe. Remote lacunar infarcts are again noted within the basal ganglia bilaterally. The brainstem infarcts are noted. IMPRESSION: 1. 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 The distal basilar artery is moderately attenuated. 2. 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 3. Mild narrowing of the cavernous internal carotid arteries bilaterally. 4. Atherosclerotic changes at the aortic arch and  bilateral carotid bifurcations without significant stenosis. 5. Posterior cervical thoracic fusion with the remote compression fracture at T2. 6.  Lucency surrounding the lateral mass screws at C3 suggests motion bilaterally. These results were called by telephone at the time of interpretation on 06/26/2016 at 7:08 pm to Dr. Wallie Char, who verbally acknowledged these results. Electronically Signed   By: San Morelle M.D.   On: 06/26/2016 19:09   Ct Angio Neck W Or Wo Contrast  06/26/2016  CLINICAL DATA:  Confusion over the last 3 days. Slurred speech. Impaired gait and difficulty walking. Acute nonhemorrhagic infarcts involving the brainstem and right occipital lobe. EXAM: CT ANGIOGRAPHY HEAD AND NECK TECHNIQUE: Multidetector CT imaging of the head and neck was performed using the standard protocol during bolus administration of intravenous contrast. Multiplanar CT image reconstructions and MIPs were obtained to evaluate the vascular anatomy. Carotid stenosis measurements (when applicable) are obtained utilizing NASCET criteria, using the distal internal carotid diameter as the denominator. CONTRAST:  75 mL Isovue 370 COMPARISON:  MRI brain and MRA head from the same day. FINDINGS: CTA NECK Aortic arch: Moderate atherosclerotic changes are present at the origins of the great vessels without significant stenosis relative to the more distal vessels. Right carotid system: Atherosclerotic changes are noted at the bifurcation of the innominate artery. There is wall thickening in the distal right common carotid artery. Atherosclerotic changes are present at the right carotid bifurcation. There is some irregularity of the proximal right ICA. Mild tortuosity is present in the more distal right ICA without significant stenosis. Left carotid system: The left common carotid artery demonstrates multifocal atherosclerotic change without a significant stenosis. Extensive calcification and noncalcified plaque is  present at the carotid bifurcation without significant stenosis. There is mild tortuosity of the distal cervical left ICA. Calcifications are present in the distal cervical left ICA just proximal to the skullbase. This may represent a previous traumatic injury without a significant residual stenosis. Vertebral arteries:The vertebral arteries both originate from the subclavian arteries. Left vertebral artery is dominant. There is some artifact from cervical spinal fusion. No significant vertebral artery stenosis is evident in the neck. Skeleton: Extensive posterior cervical thoracic fusion is present. This extends superiorly to C3. There is significant lucency surrounding the posterior element screws at C3 suggesting movement. There is no lucency at the level of C4 below. Grade 1 retrolisthesis is present at C5-6. The severe remote compression fracture is present at T2. The construct ends on the right at T3 that extends to a least T4 and T5 on the left. Other neck: No focal mucosal or submucosal lesions are present. The parotid glands are somewhat atrophic. The submandibular glands are within normal limits bilaterally. The thyroid is markedly atrophic. No significant cervical adenopathy is present. CTA HEAD Anterior circulation: Dense calcifications are present within the cavernous internal carotid arteries bilaterally. There is mild narrowing bilaterally. This appears less severe than on the MRA. MRA was artifactually exaggerated by calcifications and patient motion. The A1 and M1 segments are within normal limits. The anterior communicating artery is patent. MCA bifurcations are intact. There is mild narrowing of proximal M2 segment on the right. There is some attenuation of distal MCA and ACA branch vessels bilaterally without a significant proximal stenosis or occlusion. No aneurysm is present. Posterior circulation: Moderate stenosis present within the non dominant right vertebral artery at the dural margin. The  right vertebral artery terminates at the PICA. Is calcification and severe stenosis of the left vertebral artery at the dural margin. The left vertebral artery is occluded proximal to the vertebrobasilar junction. The proximal basilar artery is occluded. A small  distal basilar artery is reconstituted. This may represent retrograde flow via posterior communicating arteries. The posterior cerebral arteries are both moderately attenuated. Venous sinuses: The dural sinuses are patent. Anatomic variants: Posterior communicating arteries bilaterally feed the basilar tip Delayed phase: Postcontrast images demonstrate a remote encephalomalacia of the medial left occipital lobe. Remote lacunar infarcts are again noted within the basal ganglia bilaterally. The brainstem infarcts are noted. IMPRESSION: 1. 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 The distal basilar artery is moderately attenuated. 2. 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 3. Mild narrowing of the cavernous internal carotid arteries bilaterally. 4. Atherosclerotic changes at the aortic arch and bilateral carotid bifurcations without significant stenosis. 5. Posterior cervical thoracic fusion with the remote compression fracture at T2. 6. Lucency surrounding the lateral mass screws at C3 suggests motion bilaterally. These results were called by telephone at the time of interpretation on 06/26/2016 at 7:08 pm to Dr. Wallie Char, who verbally acknowledged these results. Electronically Signed   By: San Morelle M.D.   On: 06/26/2016 19:09    ASSESSMENT AND PLAN:   Principal Problem:   Altered mental status Active Problems:   Acute cerebral infarction ([facility])  * Acute stroke   Stroke work ups.   MR angio brain- multiple vascular narrowings.   ASA, Statin   Carotid doppler and Echocardiogram.    Neurology consult appreciated.   need rehab placement.   Social worker is working on authorization.  * Dysphagia   Speech consult.   MBSS done and diet ordered as per Swallow tech.  * Hypertension   Stable.  * Hypothyroidism   Levothyroxine  * Hyperlipidemia   Cont statin  All the records are reviewed and case discussed with Care Management/Social Workerr. Management plans discussed with the patient, family and they are in agreement.  CODE STATUS: Full  TOTAL TIME TAKING CARE OF THIS PATIENT: 35 minutes.   Likely d/c tomorrow to rehab POSSIBLE D/C IN 1 DAYS, DEPENDING ON CLINICAL CONDITION.   Vaughan Basta M.D on 06/28/2016   Between 7am to 6pm - Pager - [phone]  After 6pm go to [email or web address] - password [email or web address] [place] [facility]  Office  [phone]  CC: Primary care physician; Halina Maidens, MD  Note: This dictation was prepared with Dragon dictation along with smaller phrase technology. Any transcriptional errors that result from this process are unintentional.

## 2016-06-28 NOTE — Progress Notes (Signed)
Occupational Therapy Treatment Patient Details Name: Kyle Choi MRN: [id number] DOB: 10/05/1946 Today's Date: 06/28/2016    History of present illness 70 yo M presented to ED for weakness, confusion, lethargy, decreased appetite, slurred speech and was found on the floor by his daughter the day of arrival to ED. 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 PMH includes cancer, heart disease, coronary angioplasty.   OT comments  Patient seen this date for OT treatment session.  Patient able to respond verbally at times to questions and able to name his 5 kids, difficult to understand but after repeating was able to understand patient.  Patient with active shoulder retraction, elevation but only initiation of shoulder flexion of the left arm.  Able to elicit elbow flexion and extension with assistance and facilitation techniques for multiple reps/sets, supination with cues and guiding for 10 reps to neutral, wrist extension with facilitation and cues, no evidence this date of finger extension.  When therapist performs PROM of fingers, it appears like a trigger finger for the thumb IP and MF on the left.  Patient educated on positioning of LUE, elevation and ABD to help decrease edema and avoid contractures. Patient would continue to benefit from skilled OT to maximize safety and independence in daily tasks.   Follow Up Recommendations  CIR    Equipment Recommendations   (based on progress and dc plan)    Recommendations for Other Services      Precautions / Restrictions Precautions Precautions: Fall Precaution Comments: expresssive aphasia        Mobility Bed Mobility Overal bed mobility: Needs Assistance Bed Mobility: Supine to Sit     Supine to sit: Mod assist;HOB elevated        Transfers                      Balance                                   ADL Overall ADL's : Needs assistance/impaired      Grooming: Wash/dry hands;Wash/dry face;Oral care;Brushing hair;Set up;Minimal assistance                                        Vision                     Perception     Praxis      Cognition   Behavior During Therapy: [facility] for tasks assessed/performed Overall Cognitive Status: No family/caregiver present to determine baseline cognitive functioning                       Extremity/Trunk Assessment               Exercises Other Exercises Other Exercises: Patient seen for LUE PROM followed by AAROM with facilitation for movements.  Patient can perform L shoulder elevation and retraction, initiation of elbow flexion/extension with faciltation, supination to neutral, finger flexion but no evidence this date of active finger extension.  When passively moving fingers, it appears thumb IP and middle finger have a trigger response however patient denies any problem with these fingers in the past.  Mild edema in left hand, elevated on pillows and in ABD and educated patient  on positioning.     Shoulder Instructions       General Comments      Pertinent Vitals/ Pain       Pain Assessment: No/denies pain  Home Living                                          Prior Functioning/Environment              Frequency Min 1X/week     Progress Toward Goals  OT Goals(current goals can now be found in the care plan section)  Progress towards OT goals: Progressing toward goals  Acute Rehab OT Goals Patient Stated Goal: to be able to do more for myself OT Goal Formulation: With patient Time For Goal Achievement: 07/11/16 Potential to Achieve Goals: Good  Plan Discharge plan remains appropriate    Co-evaluation                 End of Session     Activity Tolerance Patient tolerated treatment well   Patient Left in bed;with call bell/phone within reach;with bed alarm set   Nurse Communication          Time:  [id number] OT Time Calculation (min): 25 min  Charges: OT General Charges $OT Visit: 1 Procedure OT Treatments $Self Care/Home Management : 8-22 mins $Therapeutic Exercise: 8-22 mins  Lovett,Amy  Amy T Lovett, OTR/L, CLT  06/28/2016, 12:55 PM

## 2016-06-28 NOTE — Progress Notes (Signed)
I reviewed recommendations and contacted son, Lennette Bihari, by phone to discuss rehab venue options and goals. I was unable to reach daughter by phone. Lennette Bihari asked appropriate questions and will discuss with his sister and call me back. I will update RN CM, Levada Dy. [phone]

## 2016-06-28 NOTE — Care Management Important Message (Signed)
Important Message  Patient Details  Name: Kyle Choi MRN: [id number] Date of Birth: 09-10-1946   Medicare Important Message Given:  Yes    [name] 06/28/2016, 10:37 AM

## 2016-06-28 NOTE — Progress Notes (Signed)
Subjective: Patient less emotionally labile but continues with significant dysarthria.    Objective: Current vital signs: BP 154/84 mmHg  Pulse 74  Temp(Src) 98.1 F (36.7 C) (Oral)  Resp 20  Ht 5\' 2"  (1.575 m)  Wt 72.757 kg (160 lb 6.4 oz)  BMI 29.33 kg/m2  SpO2 98% Vital signs in last 24 hours: Temp:  [97.4 F (36.3 C)-98.9 F (37.2 C)] 98.1 F (36.7 C) (06/30 1437) Pulse Rate:  [67-85] 74 (06/30 1437) Resp:  [18-20] 20 (06/30 1437) BP: (140-154)/(63-84) 154/84 mmHg (06/30 1437) SpO2:  [97 %-100 %] 98 % (06/30 1437)  Intake/Output from previous day: 06/29 0701 - 06/30 0700 In: 1658.8 [I.V.:1658.8] Out: 0  Intake/Output this shift: Total I/O In: 158.8 [I.V.:158.8] Out: -  Nutritional status: DIET DYS 2 Room service appropriate?: Yes with Assist; Fluid consistency:: Nectar Thick  Neurologic Exam: Mental Status: Alert, oriented. Speech dysarthric. Able to follow 3 step commands without difficulty. Cranial Nerves: II: Discs flat bilaterally; Visual fields grossly normal, pupils equal, round, reactive to light and accommodation III,IV, VI: ptosis not present, extra-ocular motions intact bilaterally V,VII: smile symmetric, facial light touch sensation normal bilaterally VIII: hearing normal bilaterally IX,X: gag reflex present XI: bilateral shoulder shrug XII: midline tongue extension Motor: Right :Upper extremity 5/5Left: Upper extremity 3/5 Lower extremity 5/5Lower extremity 3/5  Lab Results: Basic Metabolic Panel:  Recent Labs Lab 06/25/16 1840 06/26/16 0534  NA 135  --   K 4.0  --   CL 99*  --   CO2 25  --   GLUCOSE 108*  --   BUN 16  --   CREATININE 1.28* 1.29*  CALCIUM 9.4  --     Liver Function Tests:  Recent Labs Lab 06/25/16 1840  AST 22  ALT 14*  ALKPHOS 114  BILITOT 1.6*  PROT 7.9  ALBUMIN 3.9   No results for  input(s): LIPASE, AMYLASE in the last 168 hours. No results for input(s): AMMONIA in the last 168 hours.  CBC:  Recent Labs Lab 06/25/16 1840 06/26/16 0534  WBC 17.8* 14.2*  HGB 16.0 14.2  HCT 48.4 42.4  MCV 90.5 89.5  PLT 316 263    Cardiac Enzymes:  Recent Labs Lab 06/25/16 1840  TROPONINI 0.08*    Lipid Panel:  Recent Labs Lab 06/27/16 0435  CHOL 139  TRIG 86  HDL 25*  CHOLHDL 5.6  VLDL 17  LDLCALC 97    CBG: No results for input(s): GLUCAP in the last 168 hours.  Microbiology: No results found for this or any previous visit.  Coagulation Studies: No results for input(s): LABPROT, INR in the last 72 hours.  Imaging: Ct Angio Head W Or Wo Contrast  06/26/2016  CLINICAL DATA:  Confusion over the last 3 days. Slurred speech. Impaired gait and difficulty walking. Acute nonhemorrhagic infarcts involving the brainstem and right occipital lobe. EXAM: CT ANGIOGRAPHY HEAD AND NECK TECHNIQUE: Multidetector CT imaging of the head and neck was performed using the standard protocol during bolus administration of intravenous contrast. Multiplanar CT image reconstructions and MIPs were obtained to evaluate the vascular anatomy. Carotid stenosis measurements (when applicable) are obtained utilizing NASCET criteria, using the distal internal carotid diameter as the denominator. CONTRAST:  75 mL Isovue 370 COMPARISON:  MRI brain and MRA head from the same day. FINDINGS: CTA NECK Aortic arch: Moderate atherosclerotic changes are present at the origins of the great vessels without significant stenosis relative to the more distal vessels. Right carotid system: Atherosclerotic changes are noted  at the bifurcation of the innominate artery. There is wall thickening in the distal right common carotid artery. Atherosclerotic changes are present at the right carotid bifurcation. There is some irregularity of the proximal right ICA. Mild tortuosity is present in the more distal right ICA  without significant stenosis. Left carotid system: The left common carotid artery demonstrates multifocal atherosclerotic change without a significant stenosis. Extensive calcification and noncalcified plaque is present at the carotid bifurcation without significant stenosis. There is mild tortuosity of the distal cervical left ICA. Calcifications are present in the distal cervical left ICA just proximal to the skullbase. This may represent a previous traumatic injury without a significant residual stenosis. Vertebral arteries:The vertebral arteries both originate from the subclavian arteries. Left vertebral artery is dominant. There is some artifact from cervical spinal fusion. No significant vertebral artery stenosis is evident in the neck. Skeleton: Extensive posterior cervical thoracic fusion is present. This extends superiorly to C3. There is significant lucency surrounding the posterior element screws at C3 suggesting movement. There is no lucency at the level of C4 below. Grade 1 retrolisthesis is present at C5-6. The severe remote compression fracture is present at T2. The construct ends on the right at T3 that extends to a least T4 and T5 on the left. Other neck: No focal mucosal or submucosal lesions are present. The parotid glands are somewhat atrophic. The submandibular glands are within normal limits bilaterally. The thyroid is markedly atrophic. No significant cervical adenopathy is present. CTA HEAD Anterior circulation: Dense calcifications are present within the cavernous internal carotid arteries bilaterally. There is mild narrowing bilaterally. This appears less severe than on the MRA. MRA was artifactually exaggerated by calcifications and patient motion. The A1 and M1 segments are within normal limits. The anterior communicating artery is patent. MCA bifurcations are intact. There is mild narrowing of proximal M2 segment on the right. There is some attenuation of distal MCA and ACA branch  vessels bilaterally without a significant proximal stenosis or occlusion. No aneurysm is present. Posterior circulation: Moderate stenosis present within the non dominant right vertebral artery at the dural margin. The right vertebral artery terminates at the PICA. Is calcification and severe stenosis of the left vertebral artery at the dural margin. The left vertebral artery is occluded proximal to the vertebrobasilar junction. The proximal basilar artery is occluded. A small distal basilar artery is reconstituted. This may represent retrograde flow via posterior communicating arteries. The posterior cerebral arteries are both moderately attenuated. Venous sinuses: The dural sinuses are patent. Anatomic variants: Posterior communicating arteries bilaterally feed the basilar tip Delayed phase: Postcontrast images demonstrate a remote encephalomalacia of the medial left occipital lobe. Remote lacunar infarcts are again noted within the basal ganglia bilaterally. The brainstem infarcts are noted. IMPRESSION: 1. 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 The distal basilar artery is moderately attenuated. 2. 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 3. Mild narrowing of the cavernous internal carotid arteries bilaterally. 4. Atherosclerotic changes at the aortic arch and bilateral carotid bifurcations without significant stenosis. 5. Posterior cervical thoracic fusion with the remote compression fracture at T2. 6. Lucency surrounding the lateral mass screws at C3 suggests motion bilaterally. These results were called by telephone at the time of interpretation on 06/26/2016 at 7:08 pm to Dr. Wallie Char, who verbally acknowledged these results. Electronically Signed   By: San Morelle M.D.   On: 06/26/2016  19:09   Ct Angio Neck W Or Wo Contrast  06/26/2016   CLINICAL DATA:  Confusion over the last 3 days. Slurred speech. Impaired gait and difficulty walking. Acute nonhemorrhagic infarcts involving the brainstem and right occipital lobe. EXAM: CT ANGIOGRAPHY HEAD AND NECK TECHNIQUE: Multidetector CT imaging of the head and neck was performed using the standard protocol during bolus administration of intravenous contrast. Multiplanar CT image reconstructions and MIPs were obtained to evaluate the vascular anatomy. Carotid stenosis measurements (when applicable) are obtained utilizing NASCET criteria, using the distal internal carotid diameter as the denominator. CONTRAST:  75 mL Isovue 370 COMPARISON:  MRI brain and MRA head from the same day. FINDINGS: CTA NECK Aortic arch: Moderate atherosclerotic changes are present at the origins of the great vessels without significant stenosis relative to the more distal vessels. Right carotid system: Atherosclerotic changes are noted at the bifurcation of the innominate artery. There is wall thickening in the distal right common carotid artery. Atherosclerotic changes are present at the right carotid bifurcation. There is some irregularity of the proximal right ICA. Mild tortuosity is present in the more distal right ICA without significant stenosis. Left carotid system: The left common carotid artery demonstrates multifocal atherosclerotic change without a significant stenosis. Extensive calcification and noncalcified plaque is present at the carotid bifurcation without significant stenosis. There is mild tortuosity of the distal cervical left ICA. Calcifications are present in the distal cervical left ICA just proximal to the skullbase. This may represent a previous traumatic injury without a significant residual stenosis. Vertebral arteries:The vertebral arteries both originate from the subclavian arteries. Left vertebral artery is dominant. There is some artifact from cervical spinal fusion. No significant vertebral artery  stenosis is evident in the neck. Skeleton: Extensive posterior cervical thoracic fusion is present. This extends superiorly to C3. There is significant lucency surrounding the posterior element screws at C3 suggesting movement. There is no lucency at the level of C4 below. Grade 1 retrolisthesis is present at C5-6. The severe remote compression fracture is present at T2. The construct ends on the right at T3 that extends to a least T4 and T5 on the left. Other neck: No focal mucosal or submucosal lesions are present. The parotid glands are somewhat atrophic. The submandibular glands are within normal limits bilaterally. The thyroid is markedly atrophic. No significant cervical adenopathy is present. CTA HEAD Anterior circulation: Dense calcifications are present within the cavernous internal carotid arteries bilaterally. There is mild narrowing bilaterally. This appears less severe than on the MRA. MRA was artifactually exaggerated by calcifications and patient motion. The A1 and M1 segments are within normal limits. The anterior communicating artery is patent. MCA bifurcations are intact. There is mild narrowing of proximal M2 segment on the right. There is some attenuation of distal MCA and ACA branch vessels bilaterally without a significant proximal stenosis or occlusion. No aneurysm is present. Posterior circulation: Moderate stenosis present within the non dominant right vertebral artery at the dural margin. The right vertebral artery terminates at the PICA. Is calcification and severe stenosis of the left vertebral artery at the dural margin. The left vertebral artery is occluded proximal to the vertebrobasilar junction. The proximal basilar artery is occluded. A small distal basilar artery is reconstituted. This may represent retrograde flow via posterior communicating arteries. The posterior cerebral arteries are both moderately attenuated. Venous sinuses: The dural sinuses are patent. Anatomic variants:  Posterior communicating arteries bilaterally feed the basilar tip Delayed phase: Postcontrast images demonstrate a remote encephalomalacia of  the medial left occipital lobe. Remote lacunar infarcts are again noted within the basal ganglia bilaterally. The brainstem infarcts are noted. IMPRESSION: 1. 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 The distal basilar artery is moderately attenuated. 2. 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 3. Mild narrowing of the cavernous internal carotid arteries bilaterally. 4. Atherosclerotic changes at the aortic arch and bilateral carotid bifurcations without significant stenosis. 5. Posterior cervical thoracic fusion with the remote compression fracture at T2. 6. Lucency surrounding the lateral mass screws at C3 suggests motion bilaterally. These results were called by telephone at the time of interpretation on 06/26/2016 at 7:08 pm to Dr. Wallie Char, who verbally acknowledged these results. Electronically Signed   By: San Morelle M.D.   On: 06/26/2016 19:09    Medications:  I have reviewed the patient's current medications. Scheduled: . antiseptic oral rinse  7 mL Mouth Rinse q12n4p  . aspirin  300 mg Rectal Daily   Or  . aspirin  325 mg Oral Daily  . atorvastatin  40 mg Oral q1800  . chlorhexidine  15 mL Mouth Rinse BID  . clopidogrel  75 mg Oral Daily  . enoxaparin (LOVENOX) injection  40 mg Subcutaneous Q24H  . levothyroxine  100 mcg Oral QAC breakfast  . losartan  50 mg Oral Daily  . metoprolol tartrate  12.5 mg Oral BID    Assessment/Plan: Patient with no new neurological complaints.  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  A1c 5.7.  LDL 97.    Recommendations: 1.  Agree with ASA and Plavix but would decrease ASA dose to 81mg  2.  Aggressive lipid management with target LDL<70. 3.  Agree with continued therapy.     LOS: 2 days   Alexis Goodell, MD Neurology [phone] 06/28/2016  3:01 PM

## 2016-06-28 NOTE — Progress Notes (Signed)
I contacted pt's son by phone to follow up on family preference for rehab. He and his sister prefer rehab in [place]. He states his sister is coming to hospital to discuss with CM. I have alerted RN CM that family prefers SNF rehab in [place]. I wil sign off. [phone]

## 2016-06-29 MED ORDER — IPRATROPIUM-ALBUTEROL 0.5-2.5 (3) MG/3ML IN SOLN
3.0000 mL | Freq: Once | RESPIRATORY_TRACT | Status: AC
  Administered 2016-06-29: 14:00:00 3 mL via RESPIRATORY_TRACT
  Filled 2016-06-29: qty 3

## 2016-06-29 NOTE — Progress Notes (Signed)
Clinical Social Worker was informed that patient will be medically ready to discharge to [facility]. Patient and family are in a agreement with plan. CSW called [facility]  to confirm that patient's bed is ready. Provided patient's room number 405and number to call for report [phone] . All discharge information faxed to [facility] via [employer]. RN will call report and patient will discharge to [facility] via EMS.  Ernest Pine, MSW, [place], [place] Clinical Social Worker [phone]

## 2016-06-29 NOTE — Progress Notes (Signed)
Pt doing fine, without any complains.  Exam- alert and oriented, with left sided weakness.   On dysphagia diet.  Plan;  Acute stroke    As planned and working since yesterday- will d/c to rehab today.

## 2016-06-29 NOTE — Therapy (Signed)
SLP consulted RN to assess diet. RN stated he is tolerating "as well as he can". SLP discussed with pt and nursing on pt decreased intake and current diet. Pt recommended to remain on current diet as pt is expected to dc to snf today.

## 2016-06-29 NOTE — Plan of Care (Signed)
Attempts made to update Cassandra on patients status. Family did not answer return call.

## 2016-06-29 NOTE — Progress Notes (Signed)
Physical Therapy Treatment Patient Details Name: Kyle Choi MRN: [id number] DOB: 10/11/1946 Today's Date: 06/29/2016    History of Present Illness 70 yo M presented to ED for weakness, confusion, lethargy, decreased appetite, slurred speech and was found on the floor by his daughter the day of arrival to ED. 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 PMH includes cancer, heart disease, coronary angioplasty.    PT Comments    Patient is awake and agrees to treatment. He was seen for bed mobility and safety with mobility for rolling and supine to sit and sit to supine with max assist. Balance training in sitting with cuing for leaning to the left side. Therapeutic exercise including AAROM for LLE and RLE : heel slides, hip abd/add, SLR, LAQ, hip flex x 10 x 2. Patient has fatigue with sitting and exercise.   Follow Up Recommendations  CIR     Equipment Recommendations  None recommended by PT    Recommendations for Other Services       Precautions / Restrictions Precautions Precautions: Fall Precaution Comments: expresssive aphasia  Restrictions Weight Bearing Restrictions: No    Mobility  Bed Mobility Overal bed mobility: Needs Assistance Bed Mobility: Supine to Sit     Supine to sit: Mod assist;HOB elevated     General bed mobility comments: assistance to place hand on rail, cues for rolling and bringing legs of EOB, difficulty getting trunk upright  Transfers                    Ambulation/Gait                 Stairs            Wheelchair Mobility    Modified Rankin (Stroke Patients Only)       Balance Overall balance assessment: Needs assistance Sitting-balance support: Single extremity supported Sitting balance-[name] Scale: Fair Sitting balance - Comments: min guard Postural control: Right lateral lean;Posterior lean                          Cognition Arousal/Alertness:  Awake/alert Behavior During Therapy: WFL for tasks assessed/performed Overall Cognitive Status: Within Functional Limits for tasks assessed                      Exercises Other Exercises Other Exercises: B LE supine therex: ankle pumps, heel slides, hip abd slides x10 each. Cues and AAROM withi L LE for proper technique and full ROM. . Other Exercises: seated RLE LAQ, RLE hip flex    General Comments        Pertinent Vitals/Pain Pain Assessment: No/denies pain    Home Living                      Prior Function            PT Goals (current goals can now be found in the care plan section) Acute Rehab PT Goals Patient Stated Goal: to be able to do more for myself PT Goal Formulation: With patient Time For Goal Achievement: 07/11/16 Potential to Achieve Goals: Fair Progress towards PT goals: Progressing toward goals    Frequency  7X/week    PT Plan Current plan remains appropriate    Co-evaluation             End of Session Equipment Utilized During Treatment: Gait belt Activity Tolerance:  Patient tolerated treatment well;Patient limited by fatigue Patient left: with bed alarm set     Time: [id number] PT Time Calculation (min) (ACUTE ONLY): 24 min  Charges:  $Therapeutic Exercise: 8-22 mins $Therapeutic Activity: 8-22 mins                    G Codes:     Alanson Puls, PT, DPT [place], [place] S 06/29/2016, 10:31 AM

## 2016-06-29 NOTE — Progress Notes (Signed)
MD making rounds. Discharge orders received. LCSW facilitating discharge to [facility]. IV removed. Report called to Nurse at [facility]. No unanswered questions. EMS called for transport. Awaiting EMS.

## 2016-06-29 NOTE — Plan of Care (Signed)
EMS on Unit for transport. Discharge packet given to EMS. Belongings sent with patient and EMS.

## 2016-06-29 NOTE — Clinical Social Work Placement (Signed)
   CLINICAL SOCIAL WORK PLACEMENT  NOTE  Date:  06/29/2016  Patient Details  Name: Kyle Choi MRN: [id number] Date of Birth: [birth year]  Clinical Social Work is seeking post-discharge placement for this patient at the [place] level of care (*CSW will initial, date and re-position this form in  chart as items are completed):  Yes   Patient/family provided with [place] Work Department's list of facilities offering this level of care within the geographic area requested by the patient (or if unable, by the patient's family).  Yes   Patient/family informed of their freedom to choose among providers that offer the needed level of care, that participate in Medicare, Medicaid or managed care program needed by the patient, have an available bed and are willing to accept the patient.  Yes   Patient/family informed of [facility]'s ownership interest in [facility] and [facility], as well as of the fact that they are under no obligation to receive care at these facilities.  PASRR submitted to EDS on 06/27/16     PASRR number received on 06/27/16     Existing PASRR number confirmed on       FL2 transmitted to all facilities in geographic area requested by pt/family on 06/28/16     FL2 transmitted to all facilities within larger geographic area on       Patient informed that his/her managed care company has contracts with or will negotiate with certain facilities, including the following:        Yes   Patient/family informed of bed offers received.  Patient [facility] bed at [facility]     Physician recommends and patient chooses bed at      Patient to be transferred to  ([facility]) on 06/29/16.  Patient to be transferred to facility by  (EMS)     Patient family notified on 06/29/16 of transfer.  Name of family member notified:   (Daughter)     PHYSICIAN       Additional Comment:     _______________________________________________ Baldemar Lenis, LCSW 06/29/2016, 10:27 AM

## 2016-08-08 ENCOUNTER — Encounter: Payer: Self-pay | Admitting: Internal Medicine

## 2016-08-16 ENCOUNTER — Ambulatory Visit (INDEPENDENT_AMBULATORY_CARE_PROVIDER_SITE_OTHER): Payer: Medicare Other | Admitting: Internal Medicine

## 2016-08-16 ENCOUNTER — Encounter: Payer: Self-pay | Admitting: Internal Medicine

## 2016-08-16 VITALS — BP 140/80 | HR 90 | Resp 16 | Ht 62.0 in | Wt 157.0 lb

## 2016-08-16 DIAGNOSIS — F0630 Mood disorder due to known physiological condition, unspecified: Secondary | ICD-10-CM

## 2016-08-16 DIAGNOSIS — R3981 Functional urinary incontinence: Secondary | ICD-10-CM | POA: Diagnosis not present

## 2016-08-16 DIAGNOSIS — R159 Full incontinence of feces: Secondary | ICD-10-CM | POA: Diagnosis not present

## 2016-08-16 DIAGNOSIS — I639 Cerebral infarction, unspecified: Secondary | ICD-10-CM | POA: Diagnosis not present

## 2016-08-16 DIAGNOSIS — I63212 Cerebral infarction due to unspecified occlusion or stenosis of left vertebral arteries: Secondary | ICD-10-CM | POA: Diagnosis not present

## 2016-08-16 DIAGNOSIS — I69354 Hemiplegia and hemiparesis following cerebral infarction affecting left non-dominant side: Secondary | ICD-10-CM

## 2016-08-16 DIAGNOSIS — IMO0002 Reserved for concepts with insufficient information to code with codable children: Secondary | ICD-10-CM | POA: Insufficient documentation

## 2016-08-16 DIAGNOSIS — E785 Hyperlipidemia, unspecified: Secondary | ICD-10-CM

## 2016-08-16 DIAGNOSIS — I10 Essential (primary) hypertension: Secondary | ICD-10-CM

## 2016-08-16 MED ORDER — MIRTAZAPINE 15 MG PO TABS: 15.0000 mg | ORAL_TABLET | Freq: Every day | ORAL | 5 refills | Status: AC
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
## 2016-08-20 ENCOUNTER — Ambulatory Visit: Payer: Medicare Other | Admitting: Internal Medicine

## 2016-09-13 ENCOUNTER — Other Ambulatory Visit: Payer: Self-pay | Admitting: Internal Medicine

## 2016-09-17 ENCOUNTER — Telehealth: Payer: Self-pay

## 2016-09-17 NOTE — Telephone Encounter (Signed)
Called for Verbal on PT and advised to get from who started the verbal and call if any issues

## 2016-09-30 ENCOUNTER — Ambulatory Visit: Payer: Medicare Other | Admitting: Internal Medicine

## 2016-10-01 ENCOUNTER — Encounter (INDEPENDENT_AMBULATORY_CARE_PROVIDER_SITE_OTHER): Payer: Medicare Other | Admitting: Internal Medicine

## 2016-10-01 DIAGNOSIS — I2510 Atherosclerotic heart disease of native coronary artery without angina pectoris: Secondary | ICD-10-CM

## 2016-10-01 DIAGNOSIS — I69354 Hemiplegia and hemiparesis following cerebral infarction affecting left non-dominant side: Secondary | ICD-10-CM | POA: Diagnosis not present

## 2016-10-01 DIAGNOSIS — E782 Mixed hyperlipidemia: Secondary | ICD-10-CM

## 2016-10-01 DIAGNOSIS — I10 Essential (primary) hypertension: Secondary | ICD-10-CM

## 2016-10-01 DIAGNOSIS — E039 Hypothyroidism, unspecified: Secondary | ICD-10-CM

## 2016-10-01 NOTE — Progress Notes (Signed)
Received orders from [facility].  Start of care 08/04/16 through [id number] for initial certification. Orders are reviewed, signed and dated and faxed.

## 2016-10-18 ENCOUNTER — Ambulatory Visit (INDEPENDENT_AMBULATORY_CARE_PROVIDER_SITE_OTHER): Payer: Medicare Other | Admitting: Internal Medicine

## 2016-10-18 ENCOUNTER — Encounter: Payer: Self-pay | Admitting: Internal Medicine

## 2016-10-18 VITALS — BP 122/80 | HR 64 | Resp 16 | Ht 62.0 in | Wt 137.6 lb

## 2016-10-18 DIAGNOSIS — I63212 Cerebral infarction due to unspecified occlusion or stenosis of left vertebral arteries: Secondary | ICD-10-CM | POA: Diagnosis not present

## 2016-10-18 DIAGNOSIS — I10 Essential (primary) hypertension: Secondary | ICD-10-CM

## 2016-10-18 DIAGNOSIS — F0630 Mood disorder due to known physiological condition, unspecified: Secondary | ICD-10-CM

## 2016-10-18 DIAGNOSIS — I639 Cerebral infarction, unspecified: Secondary | ICD-10-CM | POA: Diagnosis not present

## 2016-10-18 DIAGNOSIS — I69354 Hemiplegia and hemiparesis following cerebral infarction affecting left non-dominant side: Secondary | ICD-10-CM

## 2016-10-18 DIAGNOSIS — IMO0002 Reserved for concepts with insufficient information to code with codable children: Secondary | ICD-10-CM

## 2016-10-18 NOTE — Progress Notes (Signed)
Date:  10/18/2016   Name:  Kyle Choi   DOB:  May 25, 1946   MRN:  [id number]   Chief Complaint: Cerebrovascular Accident (Need to get in [place] ) He has been doing well at home since his stroke. Getting around well with a walker. He says he has fallen once but without injury. He feels that he is generally getting stronger. He does not consume much in the way of food. Has lost 20 pounds in the past 2 months. He and his daughter are looking for placement kilos he can no longer stay with her. He has occasional stool incontinence related to diarrhea which he's controlling with Imodium. He quit taking potassium liquid because it made him nauseated. He is otherwise continuing on his usual medications.  Review of Systems  Constitutional: Negative for appetite change, fatigue and unexpected weight change.  Eyes: Negative for visual disturbance.  Respiratory: Negative for cough, shortness of breath and wheezing.   Cardiovascular: Negative for chest pain, palpitations and leg swelling.  Gastrointestinal: Negative for abdominal pain and blood in stool.  Endocrine: Negative for polydipsia and polyuria.  Genitourinary: Positive for urgency. Negative for dysuria and hematuria.  Musculoskeletal: Positive for gait problem.  Skin: Negative for color change and rash.  Neurological: Positive for weakness. Negative for tremors, numbness and headaches.  Psychiatric/Behavioral: Negative for dysphoric mood.    Patient Active Problem List   Diagnosis Date Noted  . 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 Fecal incontinence 08/16/2016  . Altered mental status 06/26/2016  . 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 Hemiparesis affecting left side as late effect of cerebrovascular accident ([place]) 06/25/2016  . CAD in native artery 06/20/2015  . Lymphoma in remission  ([place]) 06/20/2015  . Abnormal LFTs 06/20/2015  . Benign essential HTN 06/20/2015  . Adult hypothyroidism 06/20/2015  . Hyperlipidemia 06/20/2015    Prior to Admission medications   Medication Sig Start Date End Date Taking? Authorizing Provider  aspirin 81 MG tablet Take 1 tablet by mouth daily.   Yes Historical Provider, MD  atorvastatin (LIPITOR) 40 MG tablet TAKE ONE TABLET BY MOUTH ONCE DAILY 11/05/15  Yes Glean Hess, MD  clopidogrel (PLAVIX) 75 MG tablet Take 1 tablet (75 mg total) by mouth daily. 06/28/16  Yes Vaughan Basta, MD  levothyroxine (SYNTHROID, LEVOTHROID) 100 MCG tablet TAKE ONE TABLET BY MOUTH ONCE DAILY 09/14/16  Yes Glean Hess, MD  losartan (COZAAR) 50 MG tablet TAKE ONE TABLET BY MOUTH ONCE DAILY 02/08/16  Yes Glean Hess, MD  metoprolol tartrate (LOPRESSOR) 25 MG tablet Take 0.5 tablets (12.5 mg total) by mouth 2 (two) times daily. 05/30/16  Yes Glean Hess, MD  mirtazapine (REMERON) 15 MG tablet Take 1 tablet (15 mg total) by mouth at bedtime. 08/16/16  Yes Glean Hess, MD  potassium chloride 20 MEQ/15ML (10%) SOLN Take 7.5 mLs (10 mEq total) by mouth 2 (two) times daily. 02/21/16  Yes Glean Hess, MD    Allergies  Allergen Reactions  . Ace Inhibitors Cough    Past Surgical History:  Procedure Laterality Date  . CERVICAL DISC SURGERY     c1-7 [facility]  . CORONARY ANGIOPLASTY WITH STENT PLACEMENT      Social History  Substance Use Topics  . Smoking status: Former Research scientist (life sciences)  . Smokeless tobacco: Never Used  . Alcohol use No  Medication list has been reviewed and updated.   Physical Exam  Constitutional: He is oriented to person, place, and time. He appears well-developed. No distress.  HENT:  Head: Normocephalic and atraumatic.  Neck: Normal range of motion. Neck supple. No thyromegaly present.  Cardiovascular: Normal rate, regular rhythm and normal heart sounds.   Pulmonary/Chest: Effort normal and breath sounds normal.  No respiratory distress. He has no wheezes.  Musculoskeletal: He exhibits no edema.  Lymphadenopathy:    He has no cervical adenopathy.  Neurological: He is alert and oriented to person, place, and time. He has normal strength. He displays no tremor. Gait (uses walker) abnormal.  Very slight reduction in left grip  Skin: Skin is warm and dry. No rash noted.  Psychiatric: His speech is normal and behavior is normal. Thought content normal. His affect is labile.  Nursing note and vitals reviewed.   BP 122/80   Pulse 64   Resp 16   Ht 5\' 2"  (1.575 m)   Wt 137 lb 9.6 oz (62.4 kg)   SpO2 94%   BMI 25.17 kg/m   Assessment and Plan: 1. 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 Benign essential HTN controlled  4. Hemiparesis affecting left side as late effect of cerebrovascular accident ([place]) improved   Halina Maidens, MD [facility]  10/18/2016

## 2016-10-28 ENCOUNTER — Telehealth: Payer: Self-pay

## 2016-10-28 NOTE — Telephone Encounter (Signed)
Called complaining about pharmacy sending refill and getting nothing in return. We have not seen refill request and he does not need any for Atorvastatin or Losartan. LM

## 2016-10-30 ENCOUNTER — Telehealth: Payer: Self-pay | Admitting: Internal Medicine

## 2016-10-30 NOTE — Telephone Encounter (Signed)
Per [place]: Patient is calling in regards to a refill  Prescription that was sent over by [facility] on [address] - patient could not remember the name of the medication but stated it needed to be resent. Please advise.  + MUST HAVE THE NAME OF THE MEDICATION!!!!!!We have not gotten ANYTHING from [facility] as they probably sent the request to rehab center that he just got out.

## 2016-10-30 NOTE — Telephone Encounter (Signed)
Patient is calling in regards to a refill  Prescription that was sent over by [facility] on [address] - patient could not remember the name of the medication but stated it needed to be resent. Please advise.

## 2016-10-31 ENCOUNTER — Other Ambulatory Visit: Payer: Self-pay | Admitting: Internal Medicine

## 2016-10-31 MED ORDER — ATORVASTATIN CALCIUM 40 MG PO TABS: 40.0000 mg | ORAL_TABLET | Freq: Every day | ORAL | 5 refills | Status: AC

## 2016-10-31 MED ORDER — CLOPIDOGREL BISULFATE 75 MG PO TABS: 75.0000 mg | ORAL_TABLET | Freq: Every day | ORAL | 5 refills | Status: AC

## 2016-10-31 MED ORDER — CLOPIDOGREL BISULFATE 75 MG PO TABS: 75.0000 mg | ORAL_TABLET | Freq: Every day | ORAL | 5 refills | Status: DC

## 2016-11-26 ENCOUNTER — Encounter: Payer: Self-pay | Admitting: Internal Medicine

## 2016-11-26 ENCOUNTER — Ambulatory Visit (INDEPENDENT_AMBULATORY_CARE_PROVIDER_SITE_OTHER): Payer: Medicare Other | Admitting: Internal Medicine

## 2016-11-26 VITALS — BP 168/78 | HR 72 | Temp 98.4°F | Ht 64.0 in | Wt 146.0 lb

## 2016-11-26 DIAGNOSIS — I63212 Cerebral infarction due to unspecified occlusion or stenosis of left vertebral arteries: Secondary | ICD-10-CM

## 2016-11-26 DIAGNOSIS — E039 Hypothyroidism, unspecified: Secondary | ICD-10-CM | POA: Diagnosis not present

## 2016-11-26 DIAGNOSIS — I10 Essential (primary) hypertension: Secondary | ICD-10-CM

## 2016-11-26 DIAGNOSIS — I639 Cerebral infarction, unspecified: Secondary | ICD-10-CM

## 2016-11-26 DIAGNOSIS — F482 Pseudobulbar affect: Secondary | ICD-10-CM

## 2016-11-26 DIAGNOSIS — I69354 Hemiplegia and hemiparesis following cerebral infarction affecting left non-dominant side: Secondary | ICD-10-CM | POA: Diagnosis not present

## 2016-11-26 DIAGNOSIS — Z23 Encounter for immunization: Secondary | ICD-10-CM | POA: Diagnosis not present

## 2016-11-26 NOTE — Patient Instructions (Signed)
Can take Allegra or Claritin for runny nose  Monitor crying episodes for worsening - consider medication if so

## 2016-11-26 NOTE — Progress Notes (Signed)
Date:  11/26/2016   Name:  Kyle Choi   DOB:  Apr 28, 1946   MRN:  [id number]  Pt is accompanied today by his step-son Josh with whom he now lives. Chief Complaint: Cough (congestion with cough which is worse at night, and ringing in both ears constantly. Requesting letter for Social services stating that he needs assistance handling everything through social services.) His daughter was having his SS check deposited directly into her account.  Now his step son Merrily Pew and fiancee have him at their house.  They need to get the check "unfrozen" so he can get his funds.  Cough  This is a new problem. The cough is non-productive. Associated symptoms include rhinorrhea. Pertinent negatives include no chest pain, chills, ear pain, fever, headaches, rash, shortness of breath or wheezing. Exacerbated by: drinking.   Tinnitus - right ear primarily in the right ear - sounds like an air gun or ringing.  Hearing is slightly decreased.  CVA - he reports doing well at his step son's house.  He is eating well with no problems.  His speech is still a bit off. He walks with walker or cane.  He is having crying spells - often triggered by seeing old cars or TV shows.  Other times without reason.  His family thinks that these are decreasing.  Review of Systems  Constitutional: Negative for chills, fatigue and fever.  HENT: Positive for hearing loss and rhinorrhea. Negative for ear pain.   Respiratory: Positive for cough. Negative for chest tightness, shortness of breath and wheezing.   Cardiovascular: Negative for chest pain, palpitations and leg swelling.  Gastrointestinal: Negative for abdominal pain.  Skin: Negative for color change and rash.  Neurological: Positive for weakness. Negative for numbness and headaches.  Psychiatric/Behavioral: Positive for behavioral problems (inappropriate crying) and sleep disturbance. Negative for dysphoric mood.    Patient Active Problem List   Diagnosis Date Noted    . 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 Fecal incontinence 08/16/2016  . Altered mental status 06/26/2016  . 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 Hemiparesis affecting left side as late effect of cerebrovascular accident ([place]) 06/25/2016  . CAD in native artery 06/20/2015  . Lymphoma in remission ([place]) 06/20/2015  . Abnormal LFTs 06/20/2015  . Benign essential HTN 06/20/2015  . Adult hypothyroidism 06/20/2015  . Hyperlipidemia 06/20/2015    Prior to Admission medications   Medication Sig Start Date End Date Taking? Authorizing Provider  aspirin 81 MG tablet Take 1 tablet by mouth daily.   Yes Historical Provider, MD  atorvastatin (LIPITOR) 40 MG tablet Take 1 tablet (40 mg total) by mouth daily. 10/31/16  Yes Glean Hess, MD  clopidogrel (PLAVIX) 75 MG tablet Take 1 tablet (75 mg total) by mouth daily. 10/31/16  Yes Glean Hess, MD  levothyroxine (SYNTHROID, LEVOTHROID) 100 MCG tablet TAKE ONE TABLET BY MOUTH ONCE DAILY 09/14/16  Yes Glean Hess, MD  losartan (COZAAR) 50 MG tablet TAKE ONE TABLET BY MOUTH ONCE DAILY 02/08/16  Yes Glean Hess, MD  metoprolol tartrate (LOPRESSOR) 25 MG tablet Take 0.5 tablets (12.5 mg total) by mouth 2 (two) times daily. 05/30/16  Yes Glean Hess, MD  mirtazapine (REMERON) 15 MG tablet Take 1 tablet (15 mg total) by mouth at bedtime. 08/16/16  Yes Glean Hess, MD    Allergies  Allergen Reactions  .  Ace Inhibitors Cough    Past Surgical History:  Procedure Laterality Date  . CERVICAL DISC SURGERY     c1-7 [facility]  . CORONARY ANGIOPLASTY WITH STENT PLACEMENT      Social History  Substance Use Topics  . Smoking status: Former Research scientist (life sciences)  . Smokeless tobacco: Never Used  . Alcohol use No     Medication list has been reviewed and updated.   Physical Exam  Constitutional: He is oriented to  person, place, and time. He appears well-developed. No distress.  HENT:  Head: Normocephalic and atraumatic.  Right Ear: Tympanic membrane and ear canal normal. Decreased hearing is noted.  Left Ear: Tympanic membrane and ear canal normal. Decreased hearing is noted.  Mouth/Throat: Mucous membranes are dry. No posterior oropharyngeal edema or posterior oropharyngeal erythema.  Neck: Normal range of motion. No thyromegaly present.  Cardiovascular: Normal rate, regular rhythm and normal heart sounds.   Pulmonary/Chest: Effort normal. No respiratory distress. He has decreased breath sounds. He has no wheezes. He has no rhonchi.  Abdominal: Soft. Bowel sounds are normal.  Musculoskeletal: Normal range of motion.  Neurological: He is alert and oriented to person, place, and time.  Skin: Skin is warm and dry. No rash noted.  Psychiatric: His behavior is normal. Thought content normal. His affect is labile.  Intermittent crying to no obvious triggers  Nursing note and vitals reviewed.   BP (!) 168/78   Pulse 72   Ht 5\' 4"  (1.626 m)   Wt 146 lb (66.2 kg)   BMI 25.06 kg/m   Assessment and Plan: 1. 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 Benign essential HTN Fair control - CBC with Differential/Platelet - Comprehensive metabolic panel  3. Hemiparesis affecting left side as late effect of cerebrovascular accident ([place]) Stable- using walker or cane Swallowing function has recovered but speech is still slightly impaired  4. Pseudobulbar affect Family states that episodes of inappropriate crying are decreasing  Consider medication trial with Nuedexta  5. Adult hypothyroidism supplemented - TSH  6. Rhinorrhea May take allegra or claritin   Halina Maidens, MD [facility]  11/26/2016

## 2016-11-27 ENCOUNTER — Ambulatory Visit: Payer: Medicare Other | Admitting: Internal Medicine

## 2016-11-27 ENCOUNTER — Other Ambulatory Visit: Payer: Self-pay | Admitting: Internal Medicine

## 2016-11-27 LAB — CBC WITH DIFFERENTIAL/PLATELET
BASOS ABS: 0 10*3/uL (ref 0.0–0.2)
Basos: 0 %
EOS (ABSOLUTE): 0.5 10*3/uL — AB (ref 0.0–0.4)
Eos: 5 %
Hematocrit: 46.2 % (ref 37.5–51.0)
Hemoglobin: 15.4 g/dL (ref 12.6–17.7)
IMMATURE GRANS (ABS): 0 10*3/uL (ref 0.0–0.1)
Immature Granulocytes: 0 %
LYMPHS ABS: 1.3 10*3/uL (ref 0.7–3.1)
LYMPHS: 13 %
MCH: 29.4 pg (ref 26.6–33.0)
MCHC: 33.3 g/dL (ref 31.5–35.7)
MCV: 88 fL (ref 79–97)
Monocytes Absolute: 0.8 10*3/uL (ref 0.1–0.9)
Monocytes: 7 %
NEUTROS ABS: 7.8 10*3/uL — AB (ref 1.4–7.0)
Neutrophils: 75 %
PLATELETS: 349 10*3/uL (ref 150–379)
RBC: 5.24 x10E6/uL (ref 4.14–5.80)
RDW: 14.9 % (ref 12.3–15.4)
WBC: 10.5 10*3/uL (ref 3.4–10.8)

## 2016-11-27 LAB — COMPREHENSIVE METABOLIC PANEL
ALK PHOS: 118 IU/L — AB (ref 39–117)
ALT: 8 IU/L (ref 0–44)
AST: 10 IU/L (ref 0–40)
Albumin/Globulin Ratio: 1.4 (ref 1.2–2.2)
Albumin: 3.8 g/dL (ref 3.6–4.8)
BILIRUBIN TOTAL: 0.5 mg/dL (ref 0.0–1.2)
BUN/Creatinine Ratio: 8 — ABNORMAL LOW (ref 10–24)
BUN: 10 mg/dL (ref 8–27)
CHLORIDE: 100 mmol/L (ref 96–106)
CO2: 28 mmol/L (ref 18–29)
Calcium: 9.3 mg/dL (ref 8.6–10.2)
Creatinine, Ser: 1.23 mg/dL (ref 0.76–1.27)
GFR calc Af Amer: 69 mL/min/{1.73_m2} (ref >59)
GFR calc non Af Amer: 60 mL/min/{1.73_m2} (ref >59)
GLUCOSE: 95 mg/dL (ref 65–99)
Globulin, Total: 2.7 g/dL (ref 1.5–4.5)
Potassium: 4 mmol/L (ref 3.5–5.2)
Sodium: 144 mmol/L (ref 134–144)
Total Protein: 6.5 g/dL (ref 6.0–8.5)

## 2016-11-27 LAB — TSH: TSH: 5.66 u[IU]/mL — AB (ref 0.450–4.500)

## 2016-11-27 MED ORDER — LEVOTHYROXINE SODIUM 112 MCG PO TABS: 112.0000 ug | ORAL_TABLET | Freq: Every day | ORAL | 5 refills | Status: DC

## 2016-12-17 ENCOUNTER — Emergency Department: Payer: Medicare Other

## 2016-12-17 ENCOUNTER — Encounter: Payer: Self-pay | Admitting: Emergency Medicine

## 2016-12-17 ENCOUNTER — Inpatient Hospital Stay: Payer: Medicare Other

## 2016-12-17 ENCOUNTER — Inpatient Hospital Stay
Admission: EM | Admit: 2016-12-17 | Discharge: 2016-12-30 | DRG: 871 | Disposition: E | Payer: Medicare Other | Attending: Specialist | Admitting: Specialist

## 2016-12-17 DIAGNOSIS — Z87891 Personal history of nicotine dependence: Secondary | ICD-10-CM

## 2016-12-17 DIAGNOSIS — Z7982 Long term (current) use of aspirin: Secondary | ICD-10-CM | POA: Diagnosis not present

## 2016-12-17 DIAGNOSIS — K921 Melena: Secondary | ICD-10-CM | POA: Diagnosis present

## 2016-12-17 DIAGNOSIS — N170 Acute kidney failure with tubular necrosis: Secondary | ICD-10-CM | POA: Diagnosis present

## 2016-12-17 DIAGNOSIS — R000 Tachycardia, unspecified: Secondary | ICD-10-CM | POA: Diagnosis present

## 2016-12-17 DIAGNOSIS — R778 Other specified abnormalities of plasma proteins: Secondary | ICD-10-CM

## 2016-12-17 DIAGNOSIS — Z8572 Personal history of non-Hodgkin lymphomas: Secondary | ICD-10-CM | POA: Diagnosis not present

## 2016-12-17 DIAGNOSIS — Z955 Presence of coronary angioplasty implant and graft: Secondary | ICD-10-CM

## 2016-12-17 DIAGNOSIS — R0989 Other specified symptoms and signs involving the circulatory and respiratory systems: Secondary | ICD-10-CM

## 2016-12-17 DIAGNOSIS — I469 Cardiac arrest, cause unspecified: Secondary | ICD-10-CM | POA: Diagnosis not present

## 2016-12-17 DIAGNOSIS — Z808 Family history of malignant neoplasm of other organs or systems: Secondary | ICD-10-CM | POA: Diagnosis not present

## 2016-12-17 DIAGNOSIS — G9340 Encephalopathy, unspecified: Secondary | ICD-10-CM | POA: Diagnosis present

## 2016-12-17 DIAGNOSIS — Z9889 Other specified postprocedural states: Secondary | ICD-10-CM | POA: Diagnosis not present

## 2016-12-17 DIAGNOSIS — Z79899 Other long term (current) drug therapy: Secondary | ICD-10-CM

## 2016-12-17 DIAGNOSIS — I462 Cardiac arrest due to underlying cardiac condition: Secondary | ICD-10-CM | POA: Diagnosis present

## 2016-12-17 DIAGNOSIS — I219 Acute myocardial infarction, unspecified: Secondary | ICD-10-CM | POA: Diagnosis present

## 2016-12-17 DIAGNOSIS — R001 Bradycardia, unspecified: Secondary | ICD-10-CM | POA: Diagnosis present

## 2016-12-17 DIAGNOSIS — A419 Sepsis, unspecified organism: Secondary | ICD-10-CM

## 2016-12-17 DIAGNOSIS — E872 Acidosis: Secondary | ICD-10-CM | POA: Diagnosis present

## 2016-12-17 DIAGNOSIS — J189 Pneumonia, unspecified organism: Secondary | ICD-10-CM | POA: Diagnosis present

## 2016-12-17 DIAGNOSIS — E876 Hypokalemia: Secondary | ICD-10-CM | POA: Diagnosis present

## 2016-12-17 DIAGNOSIS — Z8249 Family history of ischemic heart disease and other diseases of the circulatory system: Secondary | ICD-10-CM | POA: Diagnosis not present

## 2016-12-17 DIAGNOSIS — K920 Hematemesis: Secondary | ICD-10-CM | POA: Diagnosis present

## 2016-12-17 DIAGNOSIS — J9690 Respiratory failure, unspecified, unspecified whether with hypoxia or hypercapnia: Secondary | ICD-10-CM | POA: Diagnosis present

## 2016-12-17 DIAGNOSIS — I10 Essential (primary) hypertension: Secondary | ICD-10-CM | POA: Diagnosis present

## 2016-12-17 DIAGNOSIS — I959 Hypotension, unspecified: Secondary | ICD-10-CM | POA: Diagnosis present

## 2016-12-17 DIAGNOSIS — R748 Abnormal levels of other serum enzymes: Secondary | ICD-10-CM | POA: Diagnosis present

## 2016-12-17 DIAGNOSIS — E860 Dehydration: Secondary | ICD-10-CM | POA: Diagnosis present

## 2016-12-17 DIAGNOSIS — J9601 Acute respiratory failure with hypoxia: Secondary | ICD-10-CM | POA: Diagnosis not present

## 2016-12-17 DIAGNOSIS — D649 Anemia, unspecified: Secondary | ICD-10-CM | POA: Diagnosis present

## 2016-12-17 DIAGNOSIS — Z888 Allergy status to other drugs, medicaments and biological substances status: Secondary | ICD-10-CM

## 2016-12-17 DIAGNOSIS — R7989 Other specified abnormal findings of blood chemistry: Secondary | ICD-10-CM

## 2016-12-17 LAB — COMPREHENSIVE METABOLIC PANEL
ALBUMIN: 2.6 g/dL — AB (ref 3.5–5.0)
ALK PHOS: 104 U/L (ref 38–126)
ALT: 13 U/L — AB (ref 17–63)
AST: 26 U/L (ref 15–41)
Anion gap: 10 (ref 5–15)
BILIRUBIN TOTAL: 0.9 mg/dL (ref 0.3–1.2)
BUN: 40 mg/dL — AB (ref 6–20)
CALCIUM: 9 mg/dL (ref 8.9–10.3)
CO2: 27 mmol/L (ref 22–32)
CREATININE: 1.66 mg/dL — AB (ref 0.61–1.24)
Chloride: 103 mmol/L (ref 101–111)
GFR calc Af Amer: 47 mL/min — ABNORMAL LOW (ref >60)
GFR, EST NON AFRICAN AMERICAN: 40 mL/min — AB (ref >60)
Glucose, Bld: 131 mg/dL — ABNORMAL HIGH (ref 65–99)
Potassium: 2.7 mmol/L — CL (ref 3.5–5.1)
Sodium: 140 mmol/L (ref 135–145)
TOTAL PROTEIN: 6.8 g/dL (ref 6.5–8.1)

## 2016-12-17 LAB — CBC WITH DIFFERENTIAL/PLATELET
BASOS PCT: 0 %
Basophils Absolute: 0.1 10*3/uL (ref 0–0.1)
EOS ABS: 0.1 10*3/uL (ref 0–0.7)
EOS PCT: 0 %
HEMATOCRIT: 33.5 % — AB (ref 40.0–52.0)
Hemoglobin: 11 g/dL — ABNORMAL LOW (ref 13.0–18.0)
Lymphocytes Relative: 3 %
Lymphs Abs: 0.7 10*3/uL — ABNORMAL LOW (ref 1.0–3.6)
MCH: 28.6 pg (ref 26.0–34.0)
MCHC: 32.8 g/dL (ref 32.0–36.0)
MCV: 87.1 fL (ref 80.0–100.0)
MONO ABS: 1.5 10*3/uL — AB (ref 0.2–1.0)
MONOS PCT: 6 %
Neutro Abs: 24.4 10*3/uL — ABNORMAL HIGH (ref 1.4–6.5)
Neutrophils Relative %: 91 %
PLATELETS: 407 10*3/uL (ref 150–440)
RBC: 3.85 MIL/uL — ABNORMAL LOW (ref 4.40–5.90)
RDW: 14.8 % — AB (ref 11.5–14.5)
WBC: 26.7 10*3/uL — ABNORMAL HIGH (ref 3.8–10.6)

## 2016-12-17 LAB — PROTIME-INR
INR: 1.48
Prothrombin Time: 18.1 seconds — ABNORMAL HIGH (ref 11.4–15.2)

## 2016-12-17 LAB — APTT: aPTT: 56 seconds — ABNORMAL HIGH (ref 24–36)

## 2016-12-17 LAB — LACTIC ACID, PLASMA: Lactic Acid, Venous: 2.2 mmol/L (ref 0.5–1.9)

## 2016-12-17 LAB — TROPONIN I: Troponin I: 0.05 ng/mL (ref <0.03)

## 2016-12-17 MED ORDER — ACETAMINOPHEN 650 MG RE SUPP: 650.0000 mg | Freq: Four times a day (QID) | RECTAL | Status: DC | PRN

## 2016-12-17 MED ORDER — SENNOSIDES-DOCUSATE SODIUM 8.6-50 MG PO TABS: 1.0000 | ORAL_TABLET | Freq: Every evening | ORAL | Status: DC | PRN

## 2016-12-17 MED ORDER — ONDANSETRON HCL 4 MG PO TABS: 4.0000 mg | ORAL_TABLET | Freq: Four times a day (QID) | ORAL | Status: DC | PRN

## 2016-12-17 MED ORDER — ASPIRIN EC 81 MG PO TBEC: 81.0000 mg | DELAYED_RELEASE_TABLET | Freq: Every day | ORAL | Status: DC

## 2016-12-17 MED ORDER — IPRATROPIUM-ALBUTEROL 0.5-2.5 (3) MG/3ML IN SOLN: 3.0000 mL | Freq: Four times a day (QID) | RESPIRATORY_TRACT | Status: DC | PRN

## 2016-12-17 MED ORDER — POTASSIUM CHLORIDE CRYS ER 20 MEQ PO TBCR
40.0000 meq | EXTENDED_RELEASE_TABLET | Freq: Once | ORAL | Status: AC
  Administered 2016-12-18: 40 meq via ORAL
  Filled 2016-12-17: qty 2

## 2016-12-17 MED ORDER — CLOPIDOGREL BISULFATE 75 MG PO TABS
75.0000 mg | ORAL_TABLET | Freq: Every day | ORAL | Status: DC
Start: 2016-12-18 — End: 2016-12-18
  Administered 2016-12-18: 75 mg via ORAL
  Filled 2016-12-17: qty 1

## 2016-12-17 MED ORDER — CEFTRIAXONE SODIUM-DEXTROSE 1-3.74 GM-% IV SOLR
1.0000 g | INTRAVENOUS | Status: DC
  Administered 2016-12-17: 1 g via INTRAVENOUS
  Filled 2016-12-17 (×2): qty 50

## 2016-12-17 MED ORDER — ALBUTEROL SULFATE (2.5 MG/3ML) 0.083% IN NEBU: 2.5000 mg | INHALATION_SOLUTION | RESPIRATORY_TRACT | Status: DC | PRN

## 2016-12-17 MED ORDER — DEXTROSE 5 % IV SOLN
500.0000 mg | INTRAVENOUS | Status: DC
  Filled 2016-12-17: qty 500

## 2016-12-17 MED ORDER — BISACODYL 5 MG PO TBEC: 5.0000 mg | DELAYED_RELEASE_TABLET | Freq: Every day | ORAL | Status: DC | PRN

## 2016-12-17 MED ORDER — ONDANSETRON HCL 4 MG/2ML IJ SOLN
4.0000 mg | Freq: Four times a day (QID) | INTRAMUSCULAR | Status: DC | PRN
  Administered 2016-12-18: 4 mg via INTRAVENOUS

## 2016-12-17 MED ORDER — ATORVASTATIN CALCIUM 20 MG PO TABS
40.0000 mg | ORAL_TABLET | Freq: Every day | ORAL | Status: DC
  Administered 2016-12-18: 40 mg via ORAL
  Filled 2016-12-17: qty 2

## 2016-12-17 MED ORDER — ACETAMINOPHEN 325 MG PO TABS: 650.0000 mg | ORAL_TABLET | Freq: Four times a day (QID) | ORAL | Status: DC | PRN

## 2016-12-17 MED ORDER — HEPARIN SODIUM (PORCINE) 5000 UNIT/ML IJ SOLN
5000.0000 [IU] | Freq: Three times a day (TID) | INTRAMUSCULAR | Status: DC
  Administered 2016-12-18 (×2): 5000 [IU] via SUBCUTANEOUS
  Filled 2016-12-17 (×2): qty 1

## 2016-12-17 MED ORDER — LEVOTHYROXINE SODIUM 112 MCG PO TABS
112.0000 ug | ORAL_TABLET | Freq: Every day | ORAL | Status: DC
  Administered 2016-12-18: 112 ug via ORAL
  Filled 2016-12-17: qty 1

## 2016-12-17 MED ORDER — DEXTROSE 5 % IV SOLN: 1.0000 g | INTRAVENOUS | Status: DC

## 2016-12-17 MED ORDER — LOPERAMIDE HCL 2 MG PO CAPS
4.0000 mg | ORAL_CAPSULE | Freq: Every day | ORAL | Status: DC
  Administered 2016-12-18: 4 mg via ORAL
  Filled 2016-12-17: qty 2

## 2016-12-17 MED ORDER — SODIUM CHLORIDE 0.9 % IV BOLUS (SEPSIS)
250.0000 mL | Freq: Once | INTRAVENOUS | Status: AC
  Administered 2016-12-17: 250 mL via INTRAVENOUS

## 2016-12-17 MED ORDER — MIRTAZAPINE 15 MG PO TABS
15.0000 mg | ORAL_TABLET | Freq: Every day | ORAL | Status: DC
  Administered 2016-12-18: 15 mg via ORAL
  Filled 2016-12-17: qty 1

## 2016-12-17 MED ORDER — DEXTROSE 5 % IV SOLN
500.0000 mg | Freq: Once | INTRAVENOUS | Status: AC
  Administered 2016-12-17: 500 mg via INTRAVENOUS
  Filled 2016-12-17: qty 500

## 2016-12-17 MED ORDER — SODIUM CHLORIDE 0.9 % IV BOLUS (SEPSIS)
1000.0000 mL | Freq: Once | INTRAVENOUS | Status: AC
  Administered 2016-12-17: 1000 mL via INTRAVENOUS

## 2016-12-17 MED ORDER — ONDANSETRON HCL 4 MG/2ML IJ SOLN
4.0000 mg | Freq: Four times a day (QID) | INTRAMUSCULAR | Status: DC | PRN
  Filled 2016-12-17: qty 2

## 2016-12-17 MED ORDER — HYDROCODONE-ACETAMINOPHEN 5-325 MG PO TABS
1.0000 | ORAL_TABLET | ORAL | Status: DC | PRN
  Administered 2016-12-18: 2 via ORAL
  Filled 2016-12-17: qty 2

## 2016-12-17 MED ORDER — POTASSIUM CHLORIDE IN NACL 40-0.9 MEQ/L-% IV SOLN
INTRAVENOUS | Status: DC
  Administered 2016-12-18: 100 mL/h via INTRAVENOUS
  Filled 2016-12-17 (×2): qty 1000

## 2016-12-17 MED ORDER — GUAIFENESIN-DM 100-10 MG/5ML PO SYRP: 5.0000 mL | ORAL_SOLUTION | ORAL | Status: DC | PRN

## 2016-12-17 MED ORDER — SODIUM CHLORIDE 0.9% FLUSH
3.0000 mL | Freq: Two times a day (BID) | INTRAVENOUS | Status: DC
  Administered 2016-12-18: 3 mL via INTRAVENOUS

## 2016-12-17 MED ORDER — CEFTRIAXONE SODIUM 1 G IJ SOLR: 1.0000 g | Freq: Once | INTRAMUSCULAR | Status: DC

## 2016-12-17 NOTE — Progress Notes (Signed)
Pharmacy Antibiotic Note  Kyle Choi is a 70 y.o. male admitted on 12/25/2016 with pneumonia.  Patient found down at home in feces. Pharmacy has been consulted for azithromycin and ceftriaxone dosing.  Plan: Patient ordered azithromycin and ceftriaxone x 1 in ED.   Will continue with:   Azithromycin 500mg  IV Q24hr x 4 doses. Next dose scheduled for 1800 on 12/20.    Ceftriaxone 1g IV Q24hr. Next dose scheduled for 1800 on 12/20.    Height: 5\' 4"  (162.6 cm) Weight: 146 lb (66.2 kg) IBW/kg (Calculated) : 59.2  Temp (24hrs), Avg:99.4 F (37.4 C), Min:99.4 F (37.4 C), Max:99.4 F (37.4 C)  No results for input(s): WBC, CREATININE, LATICACIDVEN, VANCOTROUGH, VANCOPEAK, VANCORANDOM, GENTTROUGH, GENTPEAK, GENTRANDOM, TOBRATROUGH, TOBRAPEAK, TOBRARND, AMIKACINPEAK, AMIKACINTROU, AMIKACIN in the last 168 hours.  CrCl cannot be calculated (Patient's most recent lab result is older than the maximum 21 days allowed.).    Allergies  Allergen Reactions  . Ace Inhibitors Cough    Antimicrobials this admission: Azithromycin 12/19 >> 12/23 Ceftriaxone 12/19 >>   Dose adjustments this admission: N/A  Microbiology results: 12/19 BCx: pending 12/19 UCx: pending  Pharmacy will continue to monitor and adjust per consult.   Peighton Mehra L 12/20/2016 7:30 PM

## 2016-12-17 NOTE — ED Provider Notes (Signed)
[facility] Emergency Department Provider Note  ____________________________________________   I have reviewed the triage vital signs and the nursing notes.   HISTORY  Chief Complaint Altered Mental Status   History limited by: AMS, history obtained from family   HPI Kyle Choi is a 70 y.o. male who presents to the emergency department today because of concern for altered mental status. The patient's family states that he has been having cough and congestion for roughly the past 2 days. They were trying to treat at home with OTC cold medication. The family states that when they got home after work today they noticed that the patient was confused. In addition he had primarily stayed in bed and had used the restroom in bed. The patient is normally alert and oriented.    Past Medical History:  Diagnosis Date  . Cancer ([place])   . Heart disease   . Lymphoma [facility])     Patient Active Problem List   Diagnosis Date Noted  . 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 Fecal incontinence 08/16/2016  . Altered mental status 06/26/2016  . 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 Hemiparesis affecting left side as late effect of cerebrovascular accident ([place]) 06/25/2016  . CAD in native artery 06/20/2015  . Lymphoma in remission ([place]) 06/20/2015  . Abnormal LFTs 06/20/2015  . Benign essential HTN 06/20/2015  . Adult hypothyroidism 06/20/2015  . Hyperlipidemia 06/20/2015    Past Surgical History:  Procedure Laterality Date  . CERVICAL DISC SURGERY     c1-7 [facility]  . CORONARY ANGIOPLASTY WITH STENT PLACEMENT      Prior to Admission medications   Medication Sig Start Date End Date Taking? Authorizing Provider  aspirin 81 MG tablet Take 1 tablet by mouth daily.    Historical Provider, MD   atorvastatin (LIPITOR) 40 MG tablet Take 1 tablet (40 mg total) by mouth daily. 10/31/16   Glean Hess, MD  clopidogrel (PLAVIX) 75 MG tablet Take 1 tablet (75 mg total) by mouth daily. 10/31/16   Glean Hess, MD  levothyroxine (SYNTHROID, LEVOTHROID) 112 MCG tablet Take 1 tablet (112 mcg total) by mouth daily. 11/27/16   Glean Hess, MD  losartan (COZAAR) 50 MG tablet TAKE ONE TABLET BY MOUTH ONCE DAILY 02/08/16   Glean Hess, MD  metoprolol tartrate (LOPRESSOR) 25 MG tablet Take 0.5 tablets (12.5 mg total) by mouth 2 (two) times daily. 05/30/16   Glean Hess, MD  mirtazapine (REMERON) 15 MG tablet Take 1 tablet (15 mg total) by mouth at bedtime. 08/16/16   Glean Hess, MD    Allergies Ace inhibitors  Family History  Problem Relation Age of Onset  . Heart failure Mother   . Brain cancer Brother     Social History Social History  Substance Use Topics  . Smoking status: Former Research scientist (life sciences)  . Smokeless tobacco: Never Used  . Alcohol use No    Review of Systems Unable to obtain secondary to altered mental status.  ____________________________________________   PHYSICAL EXAM:  VITAL SIGNS: ED Triage Vitals  Enc Vitals Group     BP 11/30/2016 1917 (!) 134/55     Pulse Rate 12/21/2016 1917 (!) 113     Resp 12/13/2016 1917 (!) 26     Temp 12/24/2016 1917 99.4 F (37.4 C)     Temp Source 12/15/2016  1917 Oral     SpO2 12/12/2016 1917 90 %     Weight 12/15/2016 1919 146 lb (66.2 kg)     Height 12/04/2016 1919 5\' 4"  (1.626 m)    Constitutional: Awake and alert. Not oriented.  Eyes: Conjunctivae are normal. Normal extraocular movements. ENT   Head: Normocephalic and atraumatic.   Nose: No congestion/rhinnorhea.   Mouth/Throat: Mucous membranes are moist.   Neck: No stridor. Hematological/Lymphatic/Immunilogical: No cervical lymphadenopathy. Cardiovascular: Tachycardia, regular rhythm.  No murmurs, rubs, or gallops. Respiratory: Tachypnea. Question rhonchi  to mid lungs. Gastrointestinal: Soft and non tender. No rebound. No guarding.  Genitourinary: Deferred Musculoskeletal: Normal range of motion in all extremities. No lower extremity edema. Neurologic:  Normal speech and language. No gross focal neurologic deficits are appreciated.  Skin:  Skin is warm, dry and intact. No rash noted.  ____________________________________________    LABS (pertinent positives/negatives)  Labs Reviewed  LACTIC ACID, PLASMA - Abnormal; Notable for the following:       Result Value   Lactic Acid, Venous 2.2 (*)    All other components within normal limits  COMPREHENSIVE METABOLIC PANEL - Abnormal; Notable for the following:    Potassium 2.7 (*)    Glucose, Bld 131 (*)    BUN 40 (*)    Creatinine, Ser 1.66 (*)    Albumin 2.6 (*)    ALT 13 (*)    GFR calc non Af Amer 40 (*)    GFR calc Af Amer 47 (*)    All other components within normal limits  TROPONIN I - Abnormal; Notable for the following:    Troponin I 0.05 (*)    All other components within normal limits  CBC WITH DIFFERENTIAL/PLATELET - Abnormal; Notable for the following:    WBC 26.7 (*)    RBC 3.85 (*)    Hemoglobin 11.0 (*)    HCT 33.5 (*)    RDW 14.8 (*)    Neutro Abs 24.4 (*)    Lymphs Abs 0.7 (*)    Monocytes Absolute 1.5 (*)    All other components within normal limits  CULTURE, BLOOD (ROUTINE X 2)  CULTURE, BLOOD (ROUTINE X 2)  URINE CULTURE  URINE CULTURE  LACTIC ACID, PLASMA     ____________________________________________   EKG  I, Nance Pear, attending physician, personally viewed and interpreted this EKG  EKG Time: 1931 Rate: 112 Rhythm: sinus tachycardia with PACs Axis: normal QRS: narrow ST changes: no st elevation Impression: abnormal ekg   ____________________________________________    RADIOLOGY  CXR IMPRESSION:  New basilar densities, right side greater than left. Findings are  concerning for aspiration and/or pneumonia. There is also a   questionable nodular density in the right upper lung. Recommend  follow up imaging to ensure resolution.     ____________________________________________   PROCEDURES  Procedures  CRITICAL CARE Performed by: Nance Pear   Total critical care time: 35 minutes  Critical care time was exclusive of separately billable procedures and treating other patients.  Critical care was necessary to treat or prevent imminent or life-threatening deterioration.  Critical care was time spent personally by me on the following activities: development of treatment plan with patient and/or surrogate as well as nursing, discussions with consultants, evaluation of patient's response to treatment, examination of patient, obtaining history from patient or surrogate, ordering and performing treatments and interventions, ordering and review of laboratory studies, ordering and review of radiographic studies, pulse oximetry and re-evaluation of patient's condition.  ____________________________________________   INITIAL  IMPRESSION / ASSESSMENT AND PLAN / ED COURSE  Pertinent labs & imaging results that were available during my care of the patient were reviewed by me and considered in my medical decision making (see chart for details).  Patient presented to the emergency department today because of concerns for altered mental status. Adnexal exam patient is awake and alert, not oriented. Patient's exam notable for tachycardia, tachypnea and hypoxia. Patient was called a code sepsis. Will proceed and giving him antibiotics for likely community-acquired pneumonia given clinical findings.   Patient x-ray did return concerning for pneumonia. In addition the patient's blood work is concerning for sepsis with end organ involvement. Patient was given IV antibiotics and fluids. Patient will be admitted to the hospitalist service.  ____________________________________________   FINAL CLINICAL IMPRESSION(S) /  ED DIAGNOSES  Final diagnoses:  Sepsis, due to unspecified organism [facility])  Community acquired pneumonia, unspecified laterality  Elevated troponin     Note: This dictation was prepared with Sales executive. Any transcriptional errors that result from this process are unintentional     Nance Pear, MD 12/02/2016 2021

## 2016-12-17 NOTE — ED Notes (Signed)
Pt transported to XRAY °

## 2016-12-17 NOTE — ED Triage Notes (Addendum)
Pt arrived via ems from home. Pt found by family today in feces and noted he had not moved out of bed all day. Family became concerned and called ems. Family notes cough and increased lethargy. Pt has history of a fib. Per family pt has had a stroke in past which caused emotional instability causing him to cry frequently. EMS reports room air saturation of 88%. Pt on 4L upon arrival.

## 2016-12-17 NOTE — H&P (Signed)
[place] at [place] NAME: Kyle Choi    MR#:  [id number]  DATE OF BIRTH:  [birth year]  DATE OF ADMISSION:  12/15/2016  PRIMARY CARE PHYSICIAN: Kyle Maidens, MD   REQUESTING/REFERRING PHYSICIAN: Nance Pear, MD  CHIEF COMPLAINT:   Chief Complaint  Patient presents with  . Altered Mental Status   Altered mental status today. HISTORY OF PRESENT ILLNESS:  Kyle Choi  is a 70 y.o. male with a known history of Heart disease, cancer and lymphoma. The patient was intubated ED due to altered mental status today. Per his stepson, the patient has had a cough and congestion for the past 2 days. The child to treat at home these OTC cold medication result in improvement. The patient was noted he is confused today and sent to the ED for further evaluation. He has tachycardia, tachypnea and leukocytosis. Chest x-ray showed bilateral infiltrate.   PAST MEDICAL HISTORY:   Past Medical History:  Diagnosis Date  . Cancer ([place])   . Heart disease   . Lymphoma ([place])     PAST SURGICAL HISTORY:   Past Surgical History:  Procedure Laterality Date  . CERVICAL DISC SURGERY     c1-7 [facility]  . CORONARY ANGIOPLASTY WITH STENT PLACEMENT      SOCIAL HISTORY:   Social History  Substance Use Topics  . Smoking status: Former Research scientist (life sciences)  . Smokeless tobacco: Never Used  . Alcohol use No    FAMILY HISTORY:   Family History  Problem Relation Age of Onset  . Heart failure Mother   . Brain cancer Brother     DRUG ALLERGIES:   Allergies  Allergen Reactions  . Ace Inhibitors Cough    REVIEW OF SYSTEMS:   Review of Systems  Unable to perform ROS: Mental status change    MEDICATIONS AT HOME:   Prior to Admission medications   Medication Sig Start Date End Date Taking? Authorizing Provider  atorvastatin (LIPITOR) 40 MG tablet Take 1 tablet (40 mg total) by mouth daily. Patient taking differently: Take 40 mg by mouth at bedtime.  10/31/16   Yes Glean Hess, MD  clopidogrel (PLAVIX) 75 MG tablet Take 1 tablet (75 mg total) by mouth daily. Patient taking differently: Take 75 mg by mouth at bedtime.  10/31/16  Yes Glean Hess, MD  levothyroxine (SYNTHROID, LEVOTHROID) 112 MCG tablet Take 112 mcg by mouth at bedtime.   Yes Historical Provider, MD  loperamide (IMODIUM) 2 MG capsule Take 4 mg by mouth at bedtime.   Yes Historical Provider, MD  metoprolol tartrate (LOPRESSOR) 25 MG tablet Take 0.5 tablets (12.5 mg total) by mouth 2 (two) times daily. 05/30/16  Yes Glean Hess, MD  mirtazapine (REMERON) 15 MG tablet Take 1 tablet (15 mg total) by mouth at bedtime. 08/16/16  Yes Glean Hess, MD  aspirin 81 MG tablet Take 1 tablet by mouth daily.    Historical Provider, MD  losartan (COZAAR) 50 MG tablet TAKE ONE TABLET BY MOUTH ONCE DAILY 02/08/16   Glean Hess, MD      VITAL SIGNS:  Blood pressure (!) 112/54, pulse 100, temperature 99.4 F (37.4 C), temperature source Oral, resp. rate (!) 26, height 5\' 4"  (1.626 m), weight 146 lb (66.2 kg), SpO2 96 %.  PHYSICAL EXAMINATION:  Physical Exam  GENERAL:  70 y.o.-year-old patient lying in the bed with no acute distress.  EYES: Pupils equal, round, reactive to light and accommodation. No scleral  icterus. Extraocular muscles intact.  HEENT: Head atraumatic, normocephalic. Oropharynx and nasopharynx clear. Dry oral mucosa.  NECK:  Supple, no jugular venous distention. No thyroid enlargement, no tenderness.  LUNGS: Normal breath sounds bilaterally, no wheezing, but has crackles. No use of accessory muscles of respiration.  CARDIOVASCULAR: S1, S2 normal. No murmurs, rubs, or gallops.  ABDOMEN: Soft, nontender, nondistended. Bowel sounds present. No organomegaly or mass.  EXTREMITIES: No pedal edema, cyanosis, or clubbing.  NEUROLOGIC: Cranial nerves II through XII are intact. Muscle strength 4/5 in all extremities. Sensation intact. Gait not checked.  PSYCHIATRIC: The  patient is confused. SKIN: No obvious rash, lesion, or ulcer.   LABORATORY PANEL:   CBC  Recent Labs Lab 12/24/2016 1925  WBC 26.7*  HGB 11.0*  HCT 33.5*  PLT 407   ------------------------------------------------------------------------------------------------------------------  Chemistries   Recent Labs Lab 12/28/2016 1925  NA 140  K 2.7*  CL 103  CO2 27  GLUCOSE 131*  BUN 40*  CREATININE 1.66*  CALCIUM 9.0  AST 26  ALT 13*  ALKPHOS 104  BILITOT 0.9   ------------------------------------------------------------------------------------------------------------------  Cardiac Enzymes  Recent Labs Lab 12/25/2016 1925  TROPONINI 0.05*   ------------------------------------------------------------------------------------------------------------------  RADIOLOGY:  Dg Chest 2 View  Result Date: 12/16/2016 CLINICAL DATA:  Cough and increased lethargy.  Hypoxia and AFib. EXAM: CHEST  2 VIEW COMPARISON:  06/26/2016 FINDINGS: Spinal hardware involving the cervical and upper thoracic spine. Increased densities along the medial right lung base and right infrahilar region. Increased densities along the posterior lung base on the lateral view. Heart and mediastinum are stable. Atherosclerotic calcifications at the aortic arch. Slightly prominent lung markings in the left lung base. Questionable nodular density in the right upper lung. IMPRESSION: New basilar densities, right side greater than left. Findings are concerning for aspiration and/or pneumonia. There is also a questionable nodular density in the right upper lung. Recommend follow up imaging to ensure resolution. Electronically Signed   By: Markus Daft M.D.   On: 12/16/2016 19:47      IMPRESSION AND PLAN:   Sepsis due to PNA. The patient will be admitted to medical floor. Start sepsis protocol, continue antibiotics Zithromax and Rocephin, follow up CBC and cultures. Robitussin and DuoNeb when necessary.  Acute renal  failure due to dehydration. Start IV fluid support and the follow-up BMP.  Lactic acidosis. IV fluid support, follow-up lactic acid level.  Acute Encephalopathy due to above. Aspiration and fall precautions.  Hypokalemia. Give potassium supplement and follow-up BMP.  Elevated troponin, due to demanding ischemia. Continue aspirin and Plavix.  Hypertension. Hold hypertension medications due to low BP.  All the records are reviewed and case discussed with ED provider. Management plans discussed with the patient's stepson (POA) and they are in agreement.  CODE STATUS: Full code  TOTAL TIME TAKING CARE OF THIS PATIENT: 58 minutes.    Demetrios Loll M.D on 12/22/2016 at 9:19 PM  Between 7am to 6pm - Pager - [phone]  After 6pm go to [email or web address] - Proofreader  [facility]  Office  [phone]  CC: Primary care physician; Kyle Maidens, MD   Note: This dictation was prepared with Dragon dictation along with smaller phrase technology. Any transcriptional errors that result from this process are unintentional.

## 2016-12-18 DIAGNOSIS — J9601 Acute respiratory failure with hypoxia: Secondary | ICD-10-CM

## 2016-12-18 DIAGNOSIS — I469 Cardiac arrest, cause unspecified: Secondary | ICD-10-CM

## 2016-12-18 LAB — CBC
HCT: 16.2 % — ABNORMAL LOW (ref 40.0–52.0)
HEMATOCRIT: 20.1 % — AB (ref 40.0–52.0)
HEMOGLOBIN: 6.4 g/dL — AB (ref 13.0–18.0)
Hemoglobin: 5 g/dL — ABNORMAL LOW (ref 13.0–18.0)
MCH: 28.4 pg (ref 26.0–34.0)
MCH: 28.3 pg (ref 26.0–34.0)
MCHC: 31.9 g/dL — AB (ref 32.0–36.0)
MCHC: 30.7 g/dL — ABNORMAL LOW (ref 32.0–36.0)
MCV: 89.2 fL (ref 80.0–100.0)
MCV: 92.3 fL (ref 80.0–100.0)
Platelets: 318 10*3/uL (ref 150–440)
Platelets: 269 10*3/uL (ref 150–440)
RBC: 2.25 MIL/uL — AB (ref 4.40–5.90)
RBC: 1.76 MIL/uL — ABNORMAL LOW (ref 4.40–5.90)
RDW: 14.7 % — ABNORMAL HIGH (ref 11.5–14.5)
RDW: 15.2 % — ABNORMAL HIGH (ref 11.5–14.5)
WBC: 24.9 10*3/uL — ABNORMAL HIGH (ref 3.8–10.6)
WBC: 21.7 10*3/uL — ABNORMAL HIGH (ref 3.8–10.6)

## 2016-12-18 LAB — CBC WITH DIFFERENTIAL/PLATELET
Basophils Absolute: 0.1 10*3/uL (ref 0–0.1)
Basophils Relative: 0 %
EOS ABS: 0 10*3/uL (ref 0–0.7)
Eosinophils Relative: 0 %
HCT: 27 % — ABNORMAL LOW (ref 40.0–52.0)
HEMOGLOBIN: 8.7 g/dL — AB (ref 13.0–18.0)
LYMPHS ABS: 0.9 10*3/uL — AB (ref 1.0–3.6)
LYMPHS PCT: 4 %
MCH: 28.7 pg (ref 26.0–34.0)
MCHC: 32.3 g/dL (ref 32.0–36.0)
MCV: 89 fL (ref 80.0–100.0)
MONOS PCT: 6 %
Monocytes Absolute: 1.3 10*3/uL — ABNORMAL HIGH (ref 0.2–1.0)
NEUTROS PCT: 90 %
Neutro Abs: 19.6 10*3/uL — ABNORMAL HIGH (ref 1.4–6.5)
Platelets: 289 10*3/uL (ref 150–440)
RBC: 3.03 MIL/uL — ABNORMAL LOW (ref 4.40–5.90)
RDW: 14.8 % — ABNORMAL HIGH (ref 11.5–14.5)
WBC: 21.8 10*3/uL — ABNORMAL HIGH (ref 3.8–10.6)

## 2016-12-18 LAB — BASIC METABOLIC PANEL
Anion gap: 7 (ref 5–15)
BUN: 48 mg/dL — ABNORMAL HIGH (ref 6–20)
CALCIUM: 7.6 mg/dL — AB (ref 8.9–10.3)
CO2: 24 mmol/L (ref 22–32)
Chloride: 110 mmol/L (ref 101–111)
Creatinine, Ser: 1.4 mg/dL — ABNORMAL HIGH (ref 0.61–1.24)
GFR, EST AFRICAN AMERICAN: 58 mL/min — AB (ref >60)
GFR, EST NON AFRICAN AMERICAN: 50 mL/min — AB (ref >60)
GLUCOSE: 154 mg/dL — AB (ref 65–99)
POTASSIUM: 3.4 mmol/L — AB (ref 3.5–5.1)
Sodium: 141 mmol/L (ref 135–145)

## 2016-12-18 LAB — TYPE AND SCREEN
ABO/RH(D): A POS
Antibody Screen: NEGATIVE
Unit division: 0
Unit division: 0
Unit division: 0

## 2016-12-18 LAB — URINALYSIS, COMPLETE (UACMP) WITH MICROSCOPIC
BACTERIA UA: NONE SEEN
Bilirubin Urine: NEGATIVE
Glucose, UA: NEGATIVE mg/dL
Hgb urine dipstick: NEGATIVE
KETONES UR: NEGATIVE mg/dL
LEUKOCYTES UA: NEGATIVE
Nitrite: NEGATIVE
PROTEIN: NEGATIVE mg/dL
Specific Gravity, Urine: 1.016 (ref 1.005–1.030)
pH: 6 (ref 5.0–8.0)

## 2016-12-18 LAB — TROPONIN I: Troponin I: 0.09 ng/mL (ref <0.03)

## 2016-12-18 LAB — ABO/RH: ABO/RH(D): A POS

## 2016-12-18 LAB — OCCULT BLOOD X 1 CARD TO LAB, STOOL: Fecal Occult Bld: POSITIVE — AB

## 2016-12-18 LAB — LACTIC ACID, PLASMA
LACTIC ACID, VENOUS: 1.9 mmol/L (ref 0.5–1.9)
LACTIC ACID, VENOUS: 1.4 mmol/L (ref 0.5–1.9)
LACTIC ACID, VENOUS: 3.6 mmol/L — AB (ref 0.5–1.9)

## 2016-12-18 LAB — PROCALCITONIN: Procalcitonin: 2.8 ng/mL

## 2016-12-18 LAB — MAGNESIUM: MAGNESIUM: 1.5 mg/dL — AB (ref 1.7–2.4)

## 2016-12-18 LAB — PREPARE RBC (CROSSMATCH)

## 2016-12-18 LAB — GLUCOSE, CAPILLARY
GLUCOSE-CAPILLARY: 167 mg/dL — AB (ref 65–99)
Glucose-Capillary: 146 mg/dL — ABNORMAL HIGH (ref 65–99)

## 2016-12-18 MED ORDER — PANTOPRAZOLE SODIUM 40 MG IV SOLR: 40.0000 mg | Freq: Two times a day (BID) | INTRAVENOUS | Status: DC

## 2016-12-18 MED ORDER — SODIUM CHLORIDE 0.9 % IV SOLN: INTRAVENOUS | Status: DC

## 2016-12-18 MED ORDER — PANTOPRAZOLE SODIUM 40 MG IV SOLR
40.0000 mg | Freq: Two times a day (BID) | INTRAVENOUS | Status: DC
  Administered 2016-12-18: 40 mg via INTRAVENOUS
  Filled 2016-12-18: qty 40

## 2016-12-18 MED ORDER — SODIUM CHLORIDE 0.9 % IV SOLN: Freq: Once | INTRAVENOUS | Status: DC

## 2016-12-18 MED ORDER — MORPHINE 100MG IN NS 100ML (1MG/ML) PREMIX INFUSION: 10.0000 mg/h | INTRAVENOUS | Status: DC

## 2016-12-18 MED ORDER — EPINEPHRINE PF 1 MG/10ML IJ SOSY
PREFILLED_SYRINGE | INTRAMUSCULAR | Status: AC
  Administered 2016-12-18: 1 mg
  Filled 2016-12-18: qty 10

## 2016-12-18 MED ORDER — ATROPINE SULFATE 1 MG/10ML IJ SOSY
PREFILLED_SYRINGE | INTRAMUSCULAR | Status: AC
  Administered 2016-12-18: 08:00:00
  Filled 2016-12-18: qty 10

## 2016-12-18 MED ORDER — SODIUM BICARBONATE 8.4 % IV SOLN
INTRAVENOUS | Status: AC
  Administered 2016-12-18: 50 meq
  Filled 2016-12-18: qty 50

## 2016-12-18 MED ORDER — SODIUM CHLORIDE 0.9 % IV BOLUS (SEPSIS)
500.0000 mL | Freq: Once | INTRAVENOUS | Status: AC
  Administered 2016-12-18: 500 mL via INTRAVENOUS

## 2016-12-18 MED ORDER — MAGNESIUM SULFATE 2 GM/50ML IV SOLN
2.0000 g | Freq: Once | INTRAVENOUS | Status: AC
  Administered 2016-12-18: 2 g via INTRAVENOUS
  Filled 2016-12-18: qty 50

## 2016-12-18 MED ORDER — MORPHINE SULFATE (PF) 4 MG/ML IV SOLN: 2.0000 mg | INTRAVENOUS | Status: DC | PRN

## 2016-12-18 MED ORDER — MORPHINE BOLUS VIA INFUSION
5.0000 mg | INTRAVENOUS | Status: DC | PRN
  Filled 2016-12-18: qty 20

## 2016-12-18 MED FILL — Medication: Qty: 1 | Status: AC

## 2016-12-19 ENCOUNTER — Telehealth: Payer: Self-pay

## 2016-12-19 LAB — URINE CULTURE
CULTURE: NO GROWTH
CULTURE: NO GROWTH

## 2016-12-19 NOTE — Telephone Encounter (Signed)
Spoke to patients son. He said his dad had a cold and then he went to ER and was told he has pneumonia. He said the next morning his dad was in and out of coma and then coded while on way to ICU and they did CPR and he came back and then coded again. He was placed on Vent and had all ribs broken when son decided to take him off of vent and he died yesterday. Offered condolences and son was pleased with out care for his father.

## 2016-12-22 LAB — CULTURE, BLOOD (ROUTINE X 2)
CULTURE: NO GROWTH
CULTURE: NO GROWTH

## 2016-12-24 ENCOUNTER — Telehealth: Payer: Self-pay | Admitting: Internal Medicine

## 2016-12-24 NOTE — Telephone Encounter (Signed)
I have made DK aware of this. DK will be in on Friday to sign

## 2016-12-24 NOTE — Telephone Encounter (Signed)
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 Lesleigh Noe is aware they are in the mailbox in the front office.

## 2016-12-30 NOTE — Significant Event (Signed)
Rapid Response Event Note  Overview: Time Called: 0604 Arrival Time: 0606 Event Type: Neurologic  Initial Focused Assessment: Pt had started to vomit then became unresponsive. VSS, BS WNL. Pt became more responsive and then began to vomit blood. Also had dark stool.    Interventions:Dr Marcille Blanco notified and came to bedside. Pt stable at this time  Plan of Care (if not transferred):Start protonix drip, obtain labs and monitor.   Event Summary: Name of Physician Notified: Marcille Blanco at [phone]    at    Outcome: Stayed in room and stabalized  Event End Time: 0620  Kyle Choi A

## 2016-12-30 NOTE — Plan of Care (Signed)
Primary Care Dr  Internal Medicine Department [place], [place] [address] [phone]   Adin Hector, MD   Family has decided  [facility] [address] [place], [place] [address]  Hours: 9AM-4PM Phone: [phone]

## 2016-12-30 NOTE — Progress Notes (Signed)
[place] responded to a page for Pt in Rm 236 who had a medical emergency. Pt's family was not present, [place] offered presence and support to the medical team. Pt stabilized.     12/31/16 0600  Clinical Encounter Type  Visited With Patient  Visit Type Initial;Spiritual support;Code;Critical Care  Referral From Nurse  Consult/Referral To Chaplain  Spiritual Encounters  Spiritual Needs Emotional;Other (Comment)

## 2016-12-30 NOTE — Progress Notes (Signed)
Around 0605, I was called in by NT Daniell to room, patient was dry heaving/attempting to vomit, talking and expressing that he was not feeling good, seconds later pt went unresponsive, had a pulse and BP was 112/46, oxygen was 78% on 2L, a rapid response was called and pt was put on a nonrebreathing mask, O2 saturatios were in the 90's with the mask and pt was talking and stated that he was feeling better, pt vomited blood and had a dark BM, MD Marcille Blanco and Vonna Kotyk who is pt's POA were notified. MD came to room to evaluate pt. MD and Rapid response nurse agreed that pt was stable to keep in telemetry floor. Family member also arrived to the room. MD diamond put in orders for Protonix IV, fluids, Labs, and for pt to be transferred to CCU. A bed request was sent to CCU. Around [place] called stating that pt's HR was in the 20's, the incoming nurse Lovena Le and this writer went running to the room and called in a code blue and started chest compressions, family member was at bedside. Pt was coded for approximally 20 minutes, See Code blue sheet, we got pulse back and pt was transferred to CCU.

## 2016-12-30 NOTE — ED Provider Notes (Signed)
[facility] Department of Emergency Medicine   Code Blue CONSULT NOTE  Chief Complaint: Cardiac arrest/unresponsive   Level V Caveat: Unresponsive  History of present illness: I was contacted by the hospital for a CODE BLUE cardiac arrest upstairs and presented to the patient's bedside.    ROS: Unable to obtain, Level V caveat  Scheduled Meds: . sodium chloride   Intravenous Once  . atorvastatin  40 mg Oral QHS  . azithromycin  500 mg Intravenous Q24H  . cefTRIAXone  1 g Intravenous Q24H  . heparin  5,000 Units Subcutaneous Q8H  . levothyroxine  112 mcg Oral QHS  . loperamide  4 mg Oral QHS  . mirtazapine  15 mg Oral QHS  . pantoprazole (PROTONIX) IV  40 mg Intravenous Q12H  . sodium chloride flush  3 mL Intravenous Q12H   Continuous Infusions: . sodium chloride     PRN Meds:.acetaminophen **OR** acetaminophen, acetaminophen, albuterol, bisacodyl, guaiFENesin-dextromethorphan, HYDROcodone-acetaminophen, ipratropium-albuterol, ondansetron **OR** ondansetron (ZOFRAN) IV, ondansetron (ZOFRAN) IV, senna-docusate Past Medical History:  Diagnosis Date  . Cancer ([place])   . Heart disease   . Lymphoma [facility])    Past Surgical History:  Procedure Laterality Date  . CERVICAL DISC SURGERY     c1-7 [facility]  . CORONARY ANGIOPLASTY WITH STENT PLACEMENT     Social History   Social History  . Marital status: Divorced    Spouse name: N/A  . Number of children: N/A  . Years of education: N/A   Occupational History  . retired    Social History Main Topics  . Smoking status: Former Research scientist (life sciences)  . Smokeless tobacco: Never Used  . Alcohol use No  . Drug use: No  . Sexual activity: Not on file   Other Topics Concern  . Not on file   Social History Narrative  . No narrative on file   Allergies  Allergen Reactions  . Ace Inhibitors Cough    Last set of Vital Signs (not current) Vitals:   Dec 28, 2016 0636 Dec 28, 2016 0639  BP: (!) 117/39 (!) 101/46  Pulse: 62 82  Resp:    Temp:         Physical Exam  Gen: unresponsive Cardiovascular: pulseless  Resp: apneic. Breath sounds equal bilaterally with bagging  Abd: nondistended  Neuro: GCS 3, unresponsive to pain  HEENT: blood in posterior pharynx, gag reflex absent  Neck: No crepitus  Musculoskeletal: No deformity  Skin: warm  Procedures  INTUBATION Performed by: Doran Stabler Required items: required blood products, implants, devices, and special equipment available Patient identity confirmed: provided demographic data and hospital-assigned identification number Time out: Immediately prior to procedure a "time out" was called to verify the correct patient, procedure, equipment, support staff and site/side marked as required. Indications: Cardiac arrest  Intubation method: Direct laryngoscopy Preoxygenation: BVM Sedatives: none Paralytic: none Tube Size: 7.5 cuffed Post-procedure assessment: chest rise and ETCO2 monitor Breath sounds: equal and absent over the epigastrium Tube secured by Respiratory Therapy Patient tolerated the procedure well with no immediate complications.  CRITICAL CARE Performed by: Doran Stabler Total critical care time: 70min Critical care time was exclusive of separately billable procedures and treating other patients. Critical care was necessary to treat or prevent imminent or life-threatening deterioration. Critical care was time spent personally by me on the following activities: development of treatment plan with patient and/or surrogate as well as nursing, discussions with consultants, evaluation of patient's response to treatment, examination of patient, obtaining history from patient or surrogate, ordering  and performing treatments and interventions, ordering and review of laboratory studies, ordering and review of radiographic studies, pulse oximetry and re-evaluation of patient's condition.  Cardiopulmonary Resuscitation (CPR) Procedure Note  Directed/Performed by:  Doran Stabler I personally directed ancillary staff and/or performed CPR in an effort to regain return of spontaneous circulation and to maintain cardiac, neuro and systemic perfusion.    Medical Decision making  Patient intubated. Prior to my arrival the patient was given 1 mg of epinephrine. The history from nursing was that he had become steadily more bradycardic until becoming pulseless. After compressions were held the patient had asystole on the cardiac monitor. Compressions and another milligram of epinephrine were given.    Assessment and Plan  After intubation, the ICU doctor, Dr. Callie Fielding, arrived in the room and continued the resuscitation. Care was transferred to the ICU intensivist at this time.    Orbie Pyo, MD 2016/12/29 [phone]

## 2016-12-30 NOTE — Plan of Care (Signed)
Eye Donor care performed  Dr Caryl Comes stated that he is not the primary care MD- Contacted Dr Halina Maidens awaiting  Response.

## 2016-12-30 NOTE — Plan of Care (Signed)
Called donor services

## 2016-12-30 NOTE — Consult Note (Signed)
PATIENT NAME: EWART PANDE MEDICAL RECORD NUMBER: [id number] Birthday: [birth year]  Age: 71 y.o. Admit Date: 12/10/2016  Provider:Fawnda Vitullo  Indication:asystole  Technical Description:   CPR performance duration: 20 mins  Was defibrillation or cardioversion used ?no  Was external pacer placed ? Yes but not used  Was patient intubated pre/post CPR ? yes  Was transvenous pacer placed no  Medications Administered Include      Yes/no Amiodarone   Atropin   Calcium yes  Epinephrine yes  Lidocaine   Magnesium yes  Norepinephrine   Phenylephrine   Sodium bicarbonate yes  Vasopression    Evaluation  Final Status - Was patient successfully resuscitated yes  If successfully resuscitated - what is current rhythm ST If successfully resuscitated - what is current hemodynamic status shock        PULMONARY / CRITICAL CARE MEDICINE   Name: IRVING POTEAT MRN: [id number] DOB: October 16, 1946    ADMISSION DATE:  12/24/2016 CONSULTATION DATE:  12/20  REFERRING MD : Marcille Blanco  CHIEF COMPLAINT:  Acute cardiac arrest  HISTORY OF PRESENT ILLNESS: 71 yo white male admitted to ICU for cardiac arrest  known history of Heart disease, cancer and lymphoma. - Per his stepson, the patient has had a cough and congestion for the past 2 days. The child to treat at home these OTC cold medication result in improvement. The patient was noted he is confused today and sent to the ED for further evaluation. He has tachycardia, tachypnea and leukocytosis. Chest x-ray showed bilateral infiltrate.   Now with acute GIB  Patient with acute cardiac arrest 12/20  PAST MEDICAL HISTORY :   has a past medical history of Cancer ([place]); Heart disease; and Lymphoma ([place]).  has a past surgical history that includes Cervical disc surgery and Coronary angioplasty with stent. Prior to Admission medications   Medication Sig Start Date End Date Taking? Authorizing Provider  atorvastatin (LIPITOR) 40 MG tablet Take  1 tablet (40 mg total) by mouth daily. Patient taking differently: Take 40 mg by mouth at bedtime.  10/31/16  Yes Glean Hess, MD  clopidogrel (PLAVIX) 75 MG tablet Take 1 tablet (75 mg total) by mouth daily. Patient taking differently: Take 75 mg by mouth at bedtime.  10/31/16  Yes Glean Hess, MD  levothyroxine (SYNTHROID, LEVOTHROID) 112 MCG tablet Take 112 mcg by mouth at bedtime.   Yes Historical Provider, MD  loperamide (IMODIUM) 2 MG capsule Take 4 mg by mouth at bedtime.   Yes Historical Provider, MD  metoprolol tartrate (LOPRESSOR) 25 MG tablet Take 0.5 tablets (12.5 mg total) by mouth 2 (two) times daily. 05/30/16  Yes Glean Hess, MD  mirtazapine (REMERON) 15 MG tablet Take 1 tablet (15 mg total) by mouth at bedtime. 08/16/16  Yes Glean Hess, MD  aspirin 81 MG tablet Take 1 tablet by mouth daily.    Historical Provider, MD  losartan (COZAAR) 50 MG tablet TAKE ONE TABLET BY MOUTH ONCE DAILY 02/08/16   Glean Hess, MD   Allergies  Allergen Reactions  . Ace Inhibitors Cough    FAMILY HISTORY:  indicated that his mother is deceased. He indicated that his father is deceased. He indicated that the status of his brother is unknown.   SOCIAL HISTORY:  reports that he has quit smoking. He has never used smokeless tobacco. He reports that he does not drink alcohol or use drugs.  REVIEW OF SYSTEMS:  Critically ill unobtainable    VITAL SIGNS: Temp:  [  97.6 F (36.4 C)-99.4 F (37.4 C)] 97.6 F (36.4 C) (12/20 0349) Pulse Rate:  [62-113] 82 (12/20 0639) Resp:  [18-26] 18 (12/20 0349) BP: (101-150)/(39-119) 101/46 (12/20 0639) SpO2:  [66 %-100 %] 86 % (12/20 0636) Weight:  [138 lb (62.6 kg)-146 lb (66.2 kg)] 138 lb (62.6 kg) (12/19 2249) HEMODYNAMICS:   VENTILATOR SETTINGS:   INTAKE / OUTPUT:  Intake/Output Summary (Last 24 hours) at 01/08/17 0800 Last data filed at 01/08/2017 0430  Gross per 24 hour  Intake                0 ml  Output                1 ml   Net               -1 ml    PHYSICAL EXAMINATION:  GENERAL:critically ill appearing, +resp distress HEAD: Normocephalic, atraumatic.  EYES: Pupils equal, round, reactive to light.  No scleral icterus.  MOUTH: Moist mucosal membrane. NECK: Supple. No thyromegaly. No nodules. No JVD.  PULMONARY: +rhonchi, +wheezing CARDIOVASCULAR: S1 and S2. Regular rate and rhythm. No murmurs, rubs, or gallops.  GASTROINTESTINAL: Soft, nontender, -distended. No masses. Positive bowel sounds. No hepatosplenomegaly.  MUSCULOSKELETAL: No swelling, clubbing, or edema.  NEUROLOGIC: obtunded, GCS<8T SKIN:intact,warm,dry    CBC  Recent Labs Lab 11/29/2016 2310 08-Jan-2017 0540 2017-01-08 0656  WBC 21.8* 24.9* 21.7*  HGB 8.7* 6.4* 5.0*  HCT 27.0* 20.1* 16.2*  PLT 289 318 269   Coag's  Recent Labs Lab 12/03/2016 2310  APTT 56*  INR 1.48   BMET  Recent Labs Lab 12/26/2016 1925 01/08/2017 0540  NA 140 141  K 2.7* 3.4*  CL 103 110  CO2 27 24  BUN 40* 48*  CREATININE 1.66* 1.40*  GLUCOSE 131* 154*   Electrolytes  Recent Labs Lab 12/22/2016 1925 12/21/2016 2310 01-08-17 0540  CALCIUM 9.0  --  7.6*  MG  --  1.5*  --    Sepsis Markers  Recent Labs Lab 12/06/2016 2310 01/08/2017 0158 01-08-2017 0540  LATICACIDVEN 1.9 1.4 3.6*  PROCALCITON 2.80  --   --    ABG No results for input(s): PHART, PCO2ART, PO2ART in the last 168 hours. Liver Enzymes  Recent Labs Lab 12/02/2016 1925  AST 26  ALT 13*  ALKPHOS 104  BILITOT 0.9  ALBUMIN 2.6*   Cardiac Enzymes  Recent Labs Lab 12/27/2016 1925 12/24/2016 2310  TROPONINI 0.05* 0.09*   Glucose  Recent Labs Lab 08-Jan-2017 0608  GLUCAP 167*    Imaging Dg Chest 2 View  Result Date: 12/28/2016 CLINICAL DATA:  Cough and increased lethargy.  Hypoxia and AFib. EXAM: CHEST  2 VIEW COMPARISON:  06/26/2016 FINDINGS: Spinal hardware involving the cervical and upper thoracic spine. Increased densities along the medial right lung base and right  infrahilar region. Increased densities along the posterior lung base on the lateral view. Heart and mediastinum are stable. Atherosclerotic calcifications at the aortic arch. Slightly prominent lung markings in the left lung base. Questionable nodular density in the right upper lung. IMPRESSION: New basilar densities, right side greater than left. Findings are concerning for aspiration and/or pneumonia. There is also a questionable nodular density in the right upper lung. Recommend follow up imaging to ensure resolution. Electronically Signed   By: Markus Daft M.D.   On: 12/04/2016 19:47     ASSESSMENT / PLAN:  71 yo white male with multiple medical issues admitted for acute cardiac arrest from acute ACS from  acute GIB and anemia    PULMONARY 1.Respiratory Failure -continue Full MV support -continue Bronchodilator Therapy -Wean Fio2 and PEEP as tolerated    2.CARDIOVASCULAR Cardiac arrest likely from acute ACS from acute anemia  3.RENAL-anticipate acute reanl failure from ATN Foley and UOP  GASTROINTESTINAL GIB PPI  NPO GI consult  HEMATOLOGIC Transfuse prbc's Follow h/h   ENDOCRINE - ICU hypoglycemic\Hyperglycemia protocol   NEUROLOGIC - intubated and sedated - minimal sedation to achieve a RASS goal: -1    I have personally obtained a history, examined the patient, evaluated Pertinent laboratory and [employer]/imaging results, and  formulated the assessment and plan   The Patient requires high complexity decision making for assessment and support, frequent evaluation and titration of therapies, application of advanced monitoring technologies and extensive interpretation of multiple databases. Critical Care Time devoted to patient care services described in this note is 55 minutes.   Overall, patient is critically ill, prognosis is guarded.  Patient with Multiorgan failure and at high risk for cardiac arrest and death.    Corrin Parker, M.D.  [facility]  Pulmonary & Critical Care Medicine  Medical Director [place] Director [facility] Cardio-Pulmonary Department

## 2016-12-30 NOTE — Progress Notes (Signed)
Acute Cardiac arrest for second time approx 10 mins of CPR with aystole With total down time with aystole, patient was pronounced dead at [place], M.D.  [facility] Pulmonary & Critical Care Medicine  Medical Director [place] Director [facility] Cardio-Pulmonary Department

## 2016-12-30 NOTE — Discharge Summary (Signed)
DEATH SUMMARY   Name: RICHARDD JOYNER MRN:   [id number] DOB:   [birth year]           ADMISSION DATE:  12/10/2016 CONSULTATION DATE:  2023-12-23  REFERRING MD : Marcille Blanco  CHIEF COMPLAINT:  Acute cardiac arrest Death DX cardiac arrest, acute MI   71 yo white male admitted to ICU for cardiac arrest  known history of Heart disease, cancer and lymphoma. - Per his stepson, the patient has had a cough and congestion for the past 2 days. The child to treat at home these OTC cold medication result in improvement. The patient was noted he is confused today and sent to the ED for further evaluation. He has tachycardia, tachypnea and leukocytosis. Chest x-ray showed bilateral infiltrate.    with acute GIB  Patient with acute cardiac arrest 2023/12/23  Acute Cardiac arrest first time was 20 mins CPR asysytole Acute Cardiac arrest for second time approx 10 mins of CPR with aystole With total down time with aystole, patient was pronounced dead at 825AM on 2016/12/22    Corrin Parker, M.D.  [facility] Pulmonary & Critical Care Medicine  Medical Director [place] Director [facility] Cardio-Pulmonary Department

## 2016-12-30 NOTE — Plan of Care (Signed)
[id number] am compressions  0825  am compressions

## 2016-12-30 NOTE — Plan of Care (Signed)
  Arrival Method: Bed accompanied by 4 RNs and 1 cna and 1 RT Mental Orientation: Unresponsive, intubated Telemetry: Pt placed on monitor. Central tele and Elink aware of pt's transfer Assessment: Completed Skin: wnl  IV: flushes easily, no pain, no blood return 1PIV Pain: no pain  Environmental changes completed to facilitate rest and relaxation.  Safety Measures: Bed alarm obn, 2/4 bed rails up.  Unit Orientation: Unable to do that at this time Family: Family  In waiting room     0814 am- MD made aware of pt being sinus bradycardia and trending down in the 50 s 0814 am- MD came at bedside  0816 am- 1 Epinephrine given & 1 Atropine  0816 am - code blue - compressions  0818 am 2 amps Sodium bicarbonate no pulse present- compressions  0819 am  Checked pulse - no pulse - compressions  0821 am  1 epinephrine asystole - compressions  0823 am- asystole - compressions  0824 am - 1 Epinephrine - check pulse - no pulse  0825 am - 1 sodium bicarb check pulse - no pulse  PEA,  In the 20s Family brought in, family decided to place him DNR - comfort care  Time of death 0853 am  2 nurses verified - Janett Billow RN & Seward Grater RN

## 2016-12-30 NOTE — Progress Notes (Signed)
Rapid response called to unresponsive episode preceded by hematemesis and large bloody stool.  Never lost HR or RR.  D/c Plavix and ASA.  NS 500 ml bolus.  Start NS @ 150 ml/hr. BP stable.  Protonix IV.  Hemocult stool for documentation.  INR 1.49; Check H/H. Type and screen. NPO.  Consult GI.

## 2016-12-30 NOTE — Plan of Care (Signed)
[phone]  Referral Number from donor services  Retia Passe -Representative

## 2016-12-30 DEATH — deceased

## 2017-01-01 NOTE — Telephone Encounter (Signed)
Death certificate has been signed and picked up. Nothing further needed.

## 2018-06-30 IMAGING — CR DG CHEST 2V
1 series · 2 of 2 positions shown · non-contrast
Comparison: 06/26/2016

CLINICAL DATA: Cough and increased lethargy.  Hypoxia and AFib.

EXAM:
CHEST  2 VIEW

[Series 1: w chest lat · 0.14mm/px · 2 of 2 slices shown]
[im 1/2]
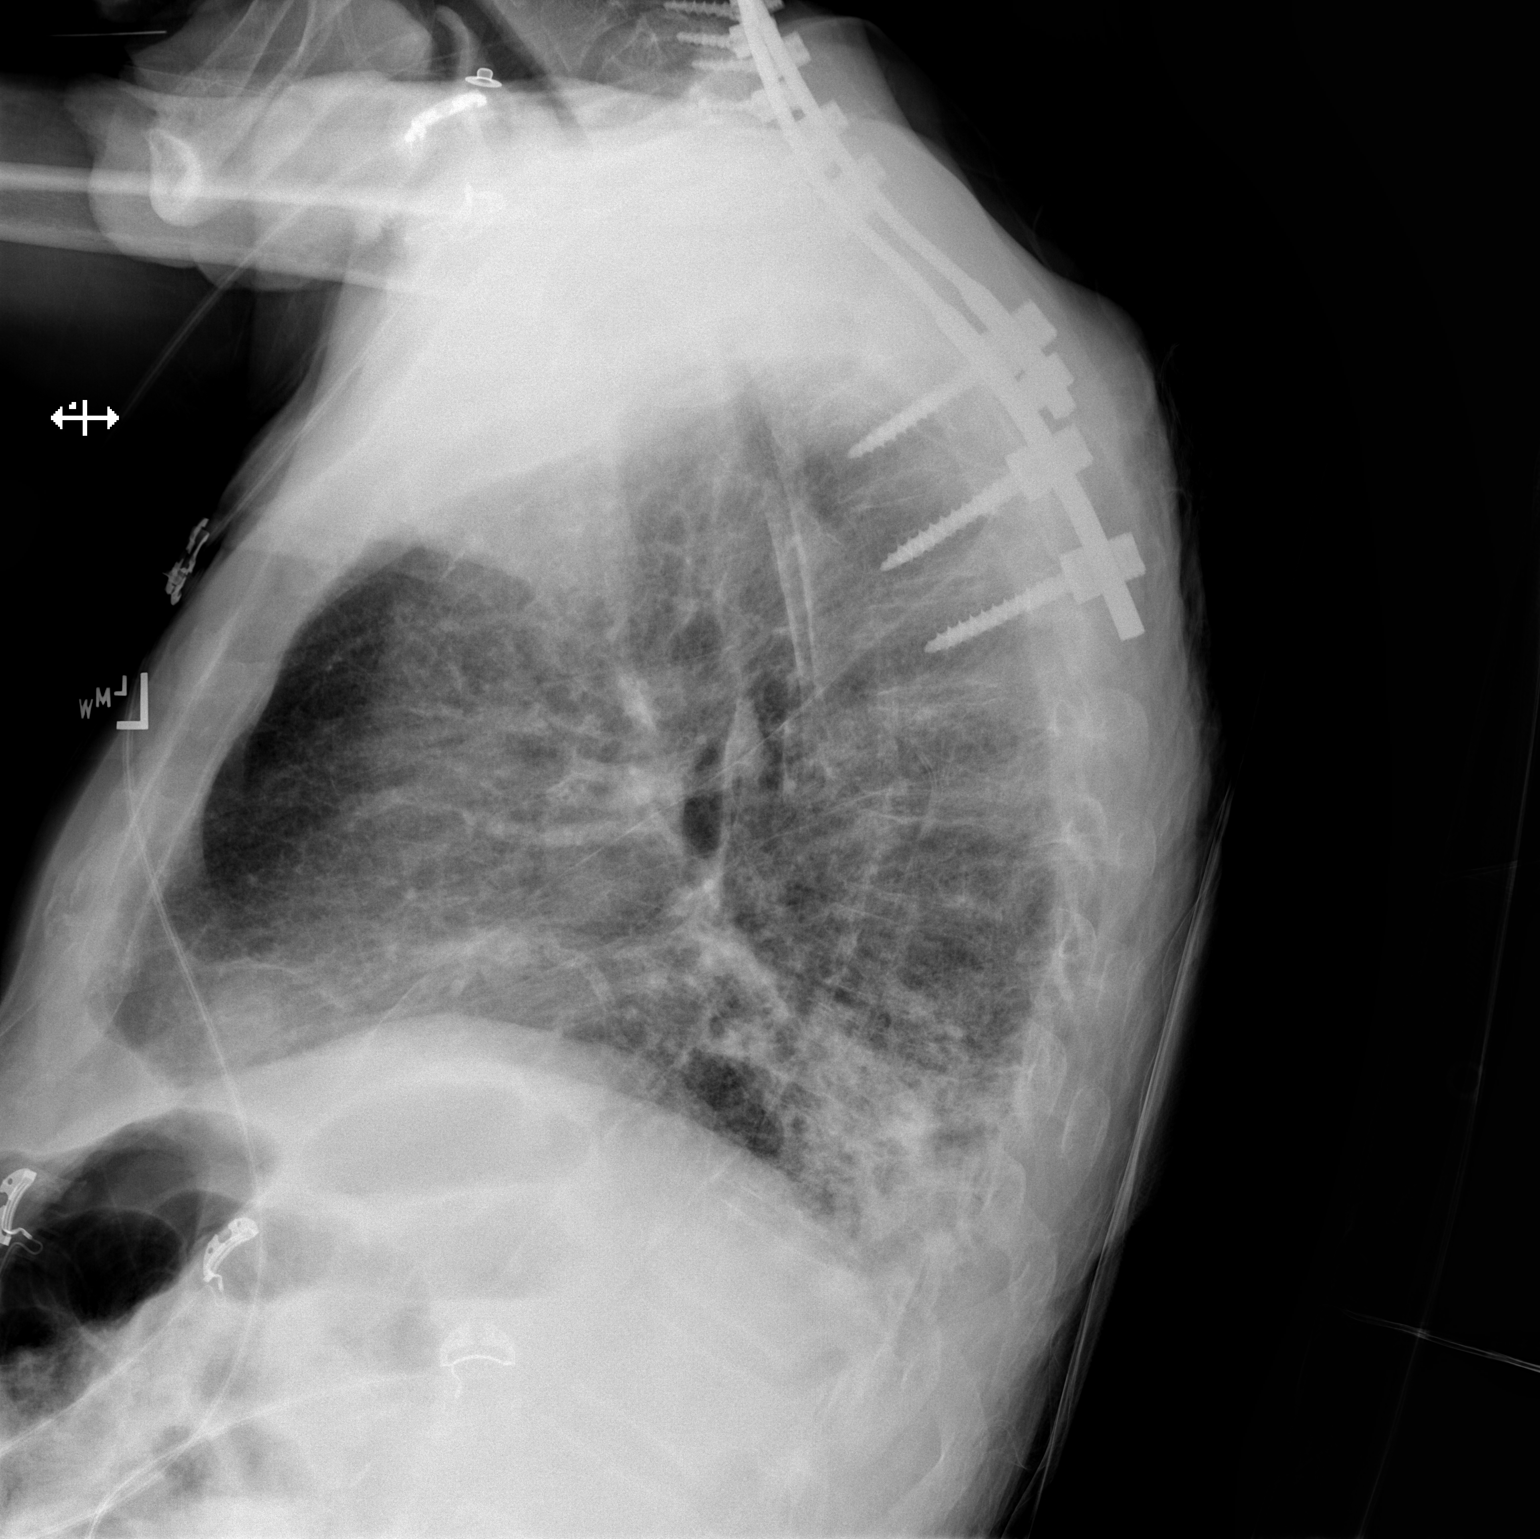
[im 2/2]
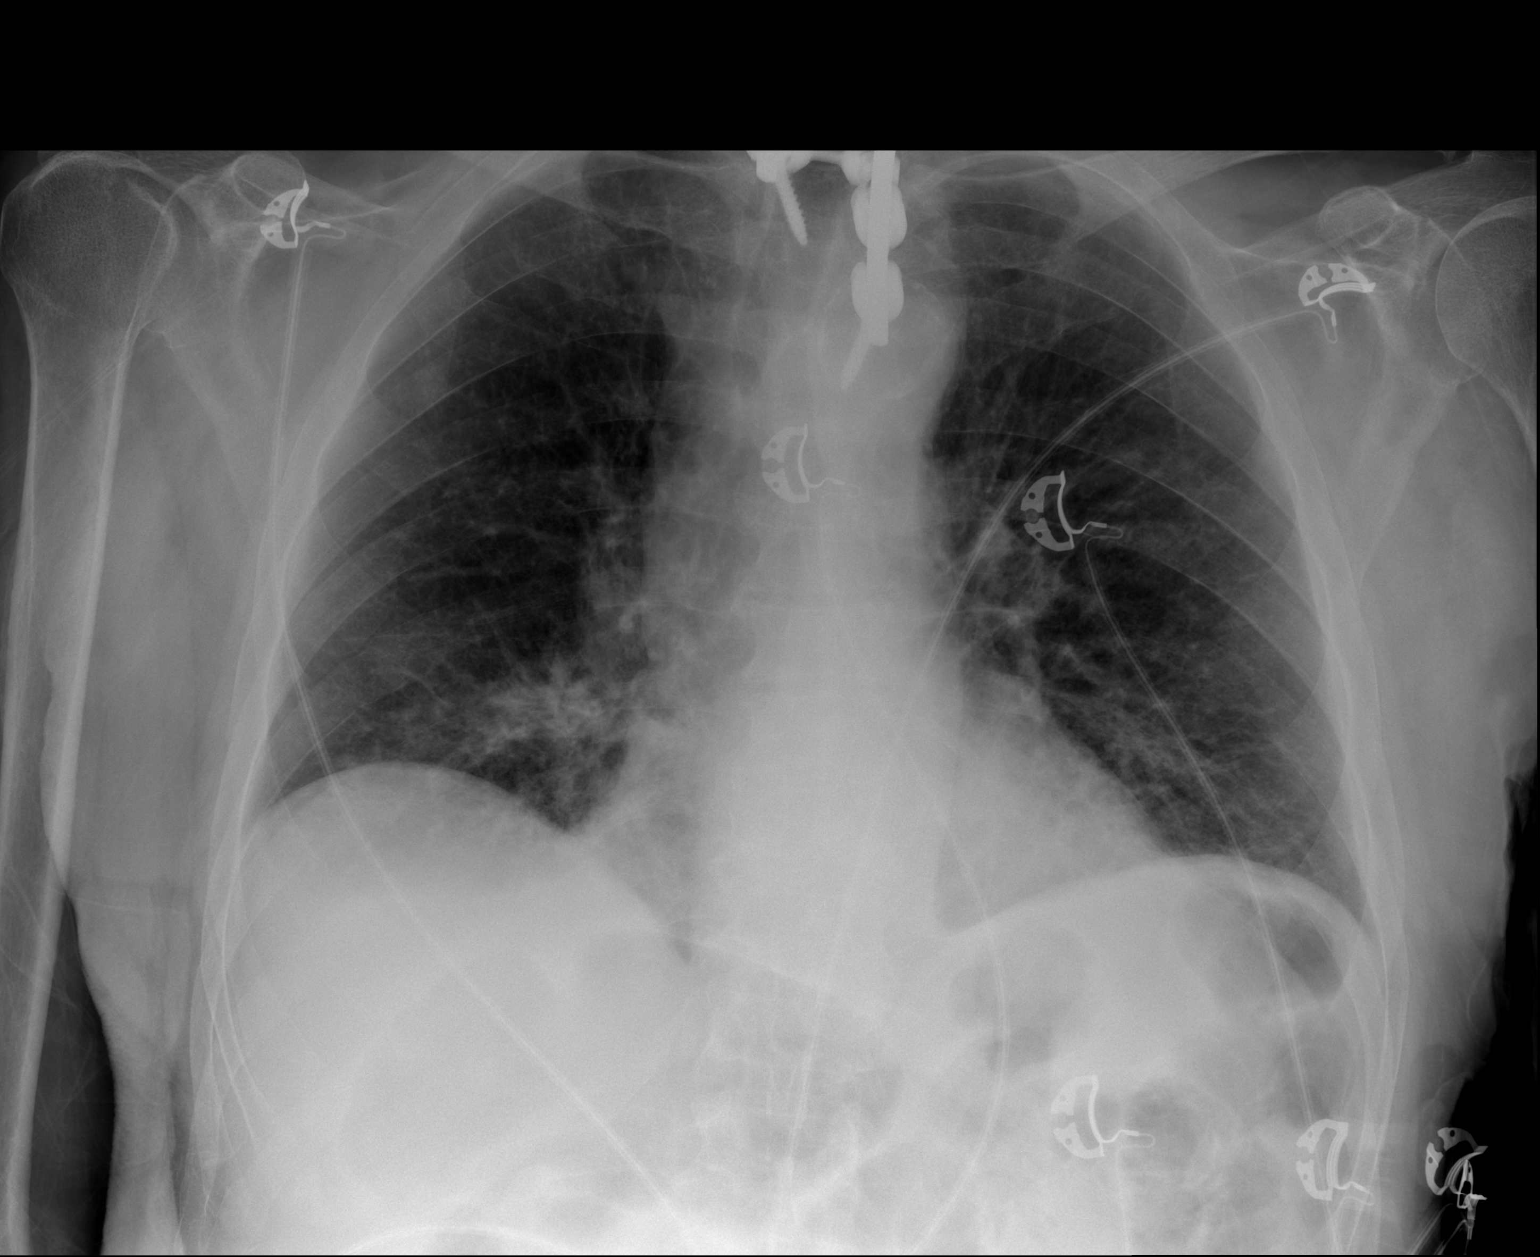

[2 of 2 positions shown; findings below may reference images not displayed]

FINDINGS: Spinal hardware involving the cervical and upper thoracic spine.
Increased densities along the medial right lung base and right
infrahilar region. Increased densities along the posterior lung base
on the lateral view. Heart and mediastinum are stable.
Atherosclerotic calcifications at the aortic arch. Slightly
prominent lung markings in the left lung base. Questionable nodular
density in the right upper lung.
IMPRESSION: New basilar densities, right side greater than left. Findings are
concerning for aspiration and/or pneumonia. There is also a
questionable nodular density in the right upper lung. Recommend
follow up imaging to ensure resolution.
